# Patient Record
Sex: Male | Born: 1950 | Race: Black or African American | Hispanic: No | Marital: Single | Smoking: Current every day smoker
Health system: Southern US, Community
[De-identification: ages and names within clinical notes are randomized; demographics above are authoritative.]

## PROBLEM LIST (undated history)

## (undated) DIAGNOSIS — I10 Essential (primary) hypertension: Secondary | ICD-10-CM

## (undated) DIAGNOSIS — E785 Hyperlipidemia, unspecified: Secondary | ICD-10-CM

---

## 2007-12-23 ENCOUNTER — Emergency Department: Payer: Self-pay | Admitting: Emergency Medicine

## 2014-01-22 ENCOUNTER — Emergency Department: Payer: Self-pay | Admitting: Emergency Medicine

## 2014-01-22 LAB — CBC
HCT: 37.7 % — ABNORMAL LOW (ref 40.0–52.0)
HGB: 12.8 g/dL — AB (ref 13.0–18.0)
MCH: 33 pg (ref 26.0–34.0)
MCHC: 33.9 g/dL (ref 32.0–36.0)
MCV: 97 fL (ref 80–100)
Platelet: 165 10*3/uL (ref 150–440)
RBC: 3.88 10*6/uL — ABNORMAL LOW (ref 4.40–5.90)
RDW: 12.5 % (ref 11.5–14.5)
WBC: 9.1 10*3/uL (ref 3.8–10.6)

## 2014-01-22 LAB — DRUG SCREEN, URINE

## 2014-01-22 LAB — MAGNESIUM: MAGNESIUM: 2 mg/dL

## 2014-01-22 LAB — BASIC METABOLIC PANEL
Anion Gap: 4 — ABNORMAL LOW (ref 7–16)
BUN: 13 mg/dL (ref 7–18)
CALCIUM: 8.7 mg/dL (ref 8.5–10.1)
Chloride: 107 mmol/L (ref 98–107)
Co2: 30 mmol/L (ref 21–32)
Creatinine: 1.13 mg/dL (ref 0.60–1.30)
EGFR (African American): >60
EGFR (Non-African Amer.): >60
GLUCOSE: 114 mg/dL — AB (ref 65–99)
Osmolality: 282 (ref 275–301)
POTASSIUM: 4.1 mmol/L (ref 3.5–5.1)
SODIUM: 141 mmol/L (ref 136–145)

## 2014-01-22 LAB — URINALYSIS, COMPLETE
BACTERIA: NONE SEEN
BLOOD: NEGATIVE
Bilirubin,UR: NEGATIVE
GLUCOSE, UR: NEGATIVE mg/dL (ref 0–75)
Ketone: NEGATIVE
Nitrite: NEGATIVE
Ph: 6 (ref 4.5–8.0)
Protein: NEGATIVE
RBC,UR: 1 /HPF (ref 0–5)
Specific Gravity: 1.019 (ref 1.003–1.030)
Squamous Epithelial: 1
WBC UR: 2 /HPF (ref 0–5)

## 2014-01-22 LAB — TROPONIN I: Troponin-I: <0.02 ng/mL

## 2017-02-05 ENCOUNTER — Other Ambulatory Visit: Payer: Self-pay | Admitting: Family Medicine

## 2017-02-05 DIAGNOSIS — Z136 Encounter for screening for cardiovascular disorders: Secondary | ICD-10-CM

## 2017-04-10 ENCOUNTER — Ambulatory Visit
Admission: RE | Admit: 2017-04-10 | Discharge: 2017-04-10 | Disposition: A | Discharge status: Home / Self Care | Payer: BLUE CROSS/BLUE SHIELD | Source: Ambulatory Visit | Attending: Family Medicine | Admitting: Family Medicine

## 2017-04-10 DIAGNOSIS — I77811 Abdominal aortic ectasia: Secondary | ICD-10-CM | POA: Diagnosis not present

## 2017-04-10 DIAGNOSIS — Z136 Encounter for screening for cardiovascular disorders: Secondary | ICD-10-CM | POA: Diagnosis not present

## 2018-10-09 ENCOUNTER — Emergency Department
Admission: EM | Admit: 2018-10-09 | Discharge: 2018-10-09 | Disposition: A | Discharge status: Home / Self Care | Payer: BLUE CROSS/BLUE SHIELD | Attending: Emergency Medicine | Admitting: Emergency Medicine

## 2018-10-09 ENCOUNTER — Encounter: Payer: Self-pay | Admitting: Emergency Medicine

## 2018-10-09 ENCOUNTER — Other Ambulatory Visit: Payer: Self-pay

## 2018-10-09 DIAGNOSIS — R51 Headache: Secondary | ICD-10-CM | POA: Insufficient documentation

## 2018-10-09 DIAGNOSIS — I10 Essential (primary) hypertension: Secondary | ICD-10-CM | POA: Insufficient documentation

## 2018-10-09 DIAGNOSIS — M542 Cervicalgia: Secondary | ICD-10-CM | POA: Diagnosis present

## 2018-10-09 DIAGNOSIS — F17200 Nicotine dependence, unspecified, uncomplicated: Secondary | ICD-10-CM | POA: Insufficient documentation

## 2018-10-09 DIAGNOSIS — R519 Headache, unspecified: Secondary | ICD-10-CM

## 2018-10-09 HISTORY — DX: Essential (primary) hypertension: I10

## 2018-10-09 MED ORDER — OXYCODONE-ACETAMINOPHEN 5-325 MG PO TABS: 1.0000 | ORAL_TABLET | Freq: Four times a day (QID) | ORAL | 0 refills | Status: AC | PRN

## 2018-10-09 MED ORDER — ACETAMINOPHEN 500 MG PO TABS
1000.0000 mg | ORAL_TABLET | Freq: Once | ORAL | Status: AC
  Administered 2018-10-09: 1000 mg via ORAL
  Filled 2018-10-09: qty 2

## 2018-10-09 MED ORDER — PREDNISONE 10 MG PO TABS: 40.0000 mg | ORAL_TABLET | Freq: Every day | ORAL | 0 refills | Status: AC

## 2018-10-09 MED ORDER — DIAZEPAM 5 MG PO TABS: 5.0000 mg | ORAL_TABLET | Freq: Three times a day (TID) | ORAL | 0 refills | Status: AC | PRN

## 2018-10-09 MED ORDER — IBUPROFEN 600 MG PO TABS
600.0000 mg | ORAL_TABLET | Freq: Once | ORAL | Status: AC
  Administered 2018-10-09: 600 mg via ORAL
  Filled 2018-10-09: qty 1

## 2018-10-09 NOTE — ED Provider Notes (Signed)
[facility] Emergency Department Provider Note  ____________________________________________  Time seen: Approximately 7:35 AM  I have reviewed the triage vital signs and the nursing notes.   HISTORY  Chief Complaint Headache    HPI Larry Dalton is a 67 y.o. male with a history of hypertension presenting with left neck pain and mild posterior headache.  The patient reports that since receiving a flu shot last Friday, he has been having a mild headache at the base of his skull with left lateral neck pain which extends to the mid clavicular area and is worse with movements of his arm or rotation of his neck. He has not had any trauma and denies any fevers or chills, numbness tingling or weakness.  No tick bites.  He has never had this in the past.  He has tried Motrin without any improvement.  Past Medical History:  Diagnosis Date  . Hypertension     There are no active problems to display for this patient.   History reviewed. No pertinent surgical history.  Current Outpatient Rx  . Order #: [id number] Class: Print  . Order #: [id number] Class: Print    Allergies Penicillins  No family history on file.  Social History Social History   Tobacco Use  . Smoking status: Current Every Day Smoker  . Smokeless tobacco: Never Used  Substance Use Topics  . Alcohol use: Not on file  . Drug use: Not on file    Review of Systems Constitutional: No fever/chills.  No trauma.  No lightheadedness or syncope.  No loss of consciousness. Eyes: No visual changes.  No blurred or double vision. ENT: No sore throat. No congestion or rhinorrhea. Cardiovascular: Denies chest pain. Denies palpitations. Respiratory: Denies shortness of breath.  No cough. Gastrointestinal: No abdominal pain.  No nausea, no vomiting.  No diarrhea.  No constipation. Musculoskeletal: Negative for back pain.  Positive for left lateral neck pain which extends to the midclavicular line and  is worse with positional changes or raising of the left arm. Skin: Negative for rash. Neurological: Positive for posterior headaches. No focal numbness, tingling or weakness.  No changes in vision, speech or ability to walk.  No changes in mental status.    ____________________________________________   PHYSICAL EXAM:  VITAL SIGNS: ED Triage Vitals  Enc Vitals Group     BP 10/09/18 0634 (!) 144/75     Pulse Rate 10/09/18 0634 81     Resp 10/09/18 0634 16     Temp 10/09/18 0634 98.6 F (37 C)     Temp Source 10/09/18 0634 Oral     SpO2 10/09/18 0634 97 %     Weight 10/09/18 0629 180 lb (81.6 kg)     Height 10/09/18 0629 6\' 1"  (1.854 m)     Head Circumference --      Peak Flow --      Pain Score 10/09/18 0629 10     Pain Loc --      Pain Edu? --      Excl. in GC? --     Constitutional: Alert and oriented. Answers questions appropriately.  Mildly uncomfortable appearing. Eyes: Conjunctivae are normal.  EOMI. PERRLA . no scleral icterus. Head: Atraumatic. Nose: No congestion/rhinnorhea. Mouth/Throat: Mucous membranes are moist.  Neck: No stridor.  Supple.  No midline C-spine tenderness to palpation, step-offs or deformities.  The patient does have palpable tenderness to palpation at the base of the left lateral neck and extending over the trapezius muscle to the  midclavicular line.  This pain is also reproducible with raising of the left arm or right or left lateral neck movement.  Normal left carotid pulse.  No evidence of skin changes including swelling, bruising or palpable crepitus. Cardiovascular: Normal rate, regular rhythm. No murmurs, rubs or gallops.  Respiratory: Normal respiratory effort.  No accessory muscle use or retractions. Lungs CTAB.  No wheezes, rales or ronchi. Musculoskeletal: No midline T or L-spine tenderness to palpation, step-offs or deformities. Neurologic:  A&Ox3.  Speech is clear.  Face and smile are symmetric.  EOMI. PERRLA.  No horizontal or vertical  nystagmus.  5 out of 5 grip strength bilaterally, biceps and triceps strength bilaterally.  Normal gait without ataxia.  Normal sensation to light touch in the bilateral upper extremities. Skin:  Skin is warm, dry and intact. No rash noted. Psychiatric: Mood and affect are normal.   ____________________________________________   LABS (all labs ordered are listed, but only abnormal results are displayed)  Labs Reviewed - No data to display ____________________________________________  EKG  Not indicated ____________________________________________  RADIOLOGY  No results found.  ____________________________________________   PROCEDURES  Procedure(s) performed: None  Procedures  Critical Care performed: No ____________________________________________   INITIAL IMPRESSION / ASSESSMENT AND PLAN / ED COURSE  Pertinent labs & imaging results that were available during my care of the patient were reviewed by me and considered in my medical decision making (see chart for details).  67 y.o. male with a history of hypertension presenting with left lateral neck pain that is positional and reproducible upon palpation without any trauma.  Overall, the patient is hemodynamically stable and afebrile.  Most likely etiology of the patient's symptoms is musculoskeletal strain.  Radiculopathy is also considered although the patient has no sensory or motor deficits, no history of spine trauma.  Meningitis is considered but very unlikely.  Torticollis, vascular compromise, or underlying mass are considered as well but again very unlikely.  At this time, we will proceed with conservative management of his symptoms; he will be discharged home with Valium as an antispasmodic, Percocet for severe pain as an adjunct to baseline Tylenol and Motrin, and a 5-day course of 40 mg prednisone.  He has been instructed to call his primary care physician for follow-up appointment on Monday.  At this time, the  patient is safe for discharge home and he understands follow-up instructions as well as return precautions.  ____________________________________________  FINAL CLINICAL IMPRESSION(S) / ED DIAGNOSES  Final diagnoses:  Neck pain on left side  Nonintractable headache, unspecified chronicity pattern, unspecified headache type         NEW MEDICATIONS STARTED DURING THIS VISIT:  New Prescriptions   DIAZEPAM (VALIUM) 5 MG TABLET    Take 1 tablet (5 mg total) by mouth every 8 (eight) hours as needed for anxiety or muscle spasms.   OXYCODONE-ACETAMINOPHEN (PERCOCET) 5-325 MG TABLET    Take 1 tablet by mouth every 6 (six) hours as needed for severe pain.      Rockne Menghini, MD 10/09/18 [phone]

## 2018-10-09 NOTE — ED Triage Notes (Signed)
Patient ambulatory to triage with steady gait, without difficulty or distress noted; pt reports HA , upper back pain x week; st pain increases with movement

## 2018-10-09 NOTE — Discharge Instructions (Signed)
Please continue to take Tylenol or Motrin for mild to moderate pain.  Percocet is for severe pain and Valium as for muscle spasms.  Do not drive within 8 hours of taking Percocet or Valium.  You may use an ice pack, or heat pack, whichever feels better, for 15 minutes every hour when you are awake on the left side of your neck.  Return to the emergency department if you develop severe pain, lightheadedness or fainting, numbness tingling or weakness, changes in vision, speech or mental status, or for any other symptoms concerning to you.

## 2018-10-14 ENCOUNTER — Telehealth: Payer: Self-pay | Admitting: *Deleted

## 2018-10-14 NOTE — Telephone Encounter (Signed)
Received referral for low dose lung cancer screening CT scan.  Unable to leave message at this time, will attempt call at later point.       

## 2018-10-21 ENCOUNTER — Telehealth: Payer: Self-pay | Admitting: *Deleted

## 2018-10-21 NOTE — Telephone Encounter (Signed)
Received referral for low dose lung cancer screening CT scan. Message left with mother to call me back to facilitate scheduling scan.

## 2018-10-23 ENCOUNTER — Telehealth: Payer: Self-pay | Admitting: *Deleted

## 2018-10-23 NOTE — Telephone Encounter (Signed)
Received referral for initial lung cancer screening scan. Contacted patient and obtained smoking history, which the patient describes is not more than 0.5 packs per day x 20 years. Discussed that patient is not eligible for lung cancer screening due to history <30 pack years. Encouraged patient to consider our smoking cessation programs.       Glenna FellowsShawn Delman Goshorn 10/23/2018, 4:05 PM

## 2018-10-23 NOTE — Telephone Encounter (Signed)
Received referral for low dose lung cancer screening CT scan. Message left at phone number listed in EMR for patient to call me back to facilitate scheduling scan.  

## 2021-01-10 ENCOUNTER — Observation Stay: Payer: BC Managed Care – PPO

## 2021-01-10 ENCOUNTER — Other Ambulatory Visit: Payer: Self-pay

## 2021-01-10 ENCOUNTER — Emergency Department: Payer: BC Managed Care – PPO

## 2021-01-10 ENCOUNTER — Encounter: Payer: Self-pay | Admitting: Emergency Medicine

## 2021-01-10 ENCOUNTER — Observation Stay
Admission: EM | Admit: 2021-01-10 | Discharge: 2021-01-11 | Disposition: A | Discharge status: Home / Self Care | Payer: BC Managed Care – PPO | Attending: Hospitalist | Admitting: Hospitalist

## 2021-01-10 DIAGNOSIS — I10 Essential (primary) hypertension: Secondary | ICD-10-CM | POA: Diagnosis not present

## 2021-01-10 DIAGNOSIS — R200 Anesthesia of skin: Secondary | ICD-10-CM | POA: Diagnosis present

## 2021-01-10 DIAGNOSIS — Z20822 Contact with and (suspected) exposure to covid-19: Secondary | ICD-10-CM | POA: Diagnosis not present

## 2021-01-10 DIAGNOSIS — F17200 Nicotine dependence, unspecified, uncomplicated: Secondary | ICD-10-CM | POA: Insufficient documentation

## 2021-01-10 DIAGNOSIS — R29898 Other symptoms and signs involving the musculoskeletal system: Secondary | ICD-10-CM

## 2021-01-10 DIAGNOSIS — I6350 Cerebral infarction due to unspecified occlusion or stenosis of unspecified cerebral artery: Principal | ICD-10-CM | POA: Insufficient documentation

## 2021-01-10 DIAGNOSIS — F17213 Nicotine dependence, cigarettes, with withdrawal: Secondary | ICD-10-CM

## 2021-01-10 DIAGNOSIS — I639 Cerebral infarction, unspecified: Secondary | ICD-10-CM | POA: Diagnosis not present

## 2021-01-10 DIAGNOSIS — R2990 Unspecified symptoms and signs involving the nervous system: Secondary | ICD-10-CM

## 2021-01-10 DIAGNOSIS — I6782 Cerebral ischemia: Secondary | ICD-10-CM | POA: Insufficient documentation

## 2021-01-10 DIAGNOSIS — M50322 Other cervical disc degeneration at C5-C6 level: Secondary | ICD-10-CM | POA: Diagnosis not present

## 2021-01-10 DIAGNOSIS — M50321 Other cervical disc degeneration at C4-C5 level: Secondary | ICD-10-CM | POA: Insufficient documentation

## 2021-01-10 DIAGNOSIS — Z79899 Other long term (current) drug therapy: Secondary | ICD-10-CM | POA: Diagnosis not present

## 2021-01-10 HISTORY — DX: Hyperlipidemia, unspecified: E78.5

## 2021-01-10 LAB — DIFFERENTIAL
Abs Immature Granulocytes: 0.01 10*3/uL (ref 0.00–0.07)
Basophils Absolute: 0 10*3/uL (ref 0.0–0.1)
Basophils Relative: 0 %
Eosinophils Absolute: 0.1 10*3/uL (ref 0.0–0.5)
Eosinophils Relative: 2 %
Immature Granulocytes: 0 %
Lymphocytes Relative: 21 %
Lymphs Abs: 1.3 10*3/uL (ref 0.7–4.0)
Monocytes Absolute: 0.4 10*3/uL (ref 0.1–1.0)
Monocytes Relative: 7 %
Neutro Abs: 4.1 10*3/uL (ref 1.7–7.7)
Neutrophils Relative %: 70 %

## 2021-01-10 LAB — COMPREHENSIVE METABOLIC PANEL
ALT: 16 U/L (ref 0–44)
AST: 23 U/L (ref 15–41)
Albumin: 4.1 g/dL (ref 3.5–5.0)
Alkaline Phosphatase: 63 U/L (ref 38–126)
Anion gap: 10 (ref 5–15)
BUN: 20 mg/dL (ref 8–23)
CO2: 25 mmol/L (ref 22–32)
Calcium: 9.2 mg/dL (ref 8.9–10.3)
Chloride: 106 mmol/L (ref 98–111)
Creatinine, Ser: 1.09 mg/dL (ref 0.61–1.24)
GFR, Estimated: >60 mL/min (ref >60)
Glucose, Bld: 158 mg/dL — ABNORMAL HIGH (ref 70–99)
Potassium: 3.5 mmol/L (ref 3.5–5.1)
Sodium: 141 mmol/L (ref 135–145)
Total Bilirubin: 0.6 mg/dL (ref 0.3–1.2)
Total Protein: 7.6 g/dL (ref 6.5–8.1)

## 2021-01-10 LAB — APTT: aPTT: 29 seconds (ref 24–36)

## 2021-01-10 LAB — CBC
HCT: 32.8 % — ABNORMAL LOW (ref 39.0–52.0)
Hemoglobin: 10.8 g/dL — ABNORMAL LOW (ref 13.0–17.0)
MCH: 32.3 pg (ref 26.0–34.0)
MCHC: 32.9 g/dL (ref 30.0–36.0)
MCV: 98.2 fL (ref 80.0–100.0)
Platelets: 161 10*3/uL (ref 150–400)
RBC: 3.34 MIL/uL — ABNORMAL LOW (ref 4.22–5.81)
RDW: 12.3 % (ref 11.5–15.5)
WBC: 5.9 10*3/uL (ref 4.0–10.5)
nRBC: 0 % (ref 0.0–0.2)

## 2021-01-10 LAB — HIV ANTIBODY (ROUTINE TESTING W REFLEX): HIV Screen 4th Generation wRfx: NONREACTIVE

## 2021-01-10 LAB — PROTIME-INR
INR: 1.1 (ref 0.8–1.2)
Prothrombin Time: 13.3 seconds (ref 11.4–15.2)

## 2021-01-10 MED ORDER — ATORVASTATIN CALCIUM 20 MG PO TABS
40.0000 mg | ORAL_TABLET | Freq: Every day | ORAL | Status: DC
  Administered 2021-01-11: 40 mg via ORAL
  Filled 2021-01-10: qty 2

## 2021-01-10 MED ORDER — ACETAMINOPHEN 160 MG/5ML PO SOLN
650.0000 mg | ORAL | Status: DC | PRN
  Filled 2021-01-10: qty 20.3

## 2021-01-10 MED ORDER — SODIUM CHLORIDE 0.9 % IV SOLN: INTRAVENOUS | Status: DC

## 2021-01-10 MED ORDER — STROKE: EARLY STAGES OF RECOVERY BOOK: Freq: Once | Status: DC

## 2021-01-10 MED ORDER — GADOBUTROL 1 MMOL/ML IV SOLN
8.0000 mL | Freq: Once | INTRAVENOUS | Status: AC | PRN
  Administered 2021-01-10: 8 mL via INTRAVENOUS

## 2021-01-10 MED ORDER — ACETAMINOPHEN 650 MG RE SUPP: 650.0000 mg | RECTAL | Status: DC | PRN

## 2021-01-10 MED ORDER — ASPIRIN 300 MG RE SUPP
300.0000 mg | Freq: Every day | RECTAL | Status: DC
  Filled 2021-01-10: qty 1

## 2021-01-10 MED ORDER — ASPIRIN 325 MG PO TABS
325.0000 mg | ORAL_TABLET | Freq: Every day | ORAL | Status: DC
  Administered 2021-01-10 – 2021-01-11 (×2): 325 mg via ORAL
  Filled 2021-01-10 (×2): qty 1

## 2021-01-10 MED ORDER — ACETAMINOPHEN 325 MG PO TABS: 650.0000 mg | ORAL_TABLET | ORAL | Status: DC | PRN

## 2021-01-10 NOTE — ED Notes (Signed)
Neurologist at bedside. 

## 2021-01-10 NOTE — Consult Note (Signed)
Neurology Consultation Reason for Consult: Left-sided weakness Referring Physician: Erma Heritage, C  CC: Arm weakness  History is obtained from: Patient  HPI: Larry Dalton is a 70 y.o. male who was last definitely in his normal state of health on Sunday evening.  On Monday morning, he did not use his left hand much until he got to work and at that point he noticed that he was having trouble using it as he normally does.  He worked both Monday and yesterday and given that it was still having problems, he sought care in the emergency department today.  There he describes it as "numbness" what he means by that is that it will not do what he wants it to, not that it has a difference in sensation.   LKW: Sunday evening tpa given?: no, out of window    ROS: A 14 point ROS was performed and is negative except as noted in the HPI.  Past Medical History:  Diagnosis Date  . Hyperlipidemia   . Hypertension      History reviewed. No pertinent family history.   Social History:  reports that he has been smoking. He has never used smokeless tobacco. No history on file for alcohol use and drug use.   Exam: Current vital signs: BP (!) 149/78   Pulse (!) 53   Temp 98.3 F (36.8 C) (Oral)   Resp 17   Ht 6\' 2"  (1.88 m)   Wt 81.6 kg   SpO2 100%   BMI 23.11 kg/m  Vital signs in last 24 hours: Temp:  [98.3 F (36.8 C)] 98.3 F (36.8 C) (02/02 1144) Pulse Rate:  [47-66] 53 (02/02 1430) Resp:  [11-17] 17 (02/02 1430) BP: (141-153)/(75-91) 149/78 (02/02 1430) SpO2:  [99 %-100 %] 100 % (02/02 1430) Weight:  [81.6 kg] 81.6 kg (02/02 1144)   Physical Exam  Constitutional: Appears well-developed and well-nourished.  Psych: Affect appropriate to situation Eyes: No scleral injection HENT: No OP obstruction MSK: no joint deformities.  Cardiovascular: Normal rate and regular rhythm.  Respiratory: Effort normal, non-labored breathing GI: Soft.  No distension. There is no tenderness.  Skin:  WDI  Neuro: Mental Status: Patient is awake, alert, oriented to person, place, month, year, and situation. Patient is able to give a clear and coherent history. No signs of aphasia or neglect Cranial Nerves: II: Visual Fields are full. Pupils are equal, round, and reactive to light.   III,IV, VI: EOMI without ptosis or diploplia.  V: Facial sensation is symmetric to temperature VII: Facial movement with very mild left facial weakness VIII: hearing is intact to voice X: Uvula elevates symmetrically XI: Shoulder shrug is symmetric. XII: tongue is midline without atrophy or fasciculations.  Motor: Tone is normal. Bulk is normal. 5/5 strength was present in bilateral legs in the right arm, he has 4/5 weakness of multiple muscle groups involving multiple nerves in the left arm Sensory: Sensation is symmetric to light touch and temperature in the arms and legs. Cerebellar: No clear ataxia     I have reviewed labs in epic and the results pertinent to this consultation are: Creatinine 1.09  I have reviewed the images obtained: CT head-negative  Impression: 70 year old male with history of hypertension and hyperlipidemia who presents with left-sided deficits of 2 days duration.  I suspect that he has had a small ischemic stroke, and he will need to be admitted for work-up of such.  Recommendations: - HgbA1c, fasting lipid panel - MRI, MRA  of the  brain without contrast, MRA neck with/without contrast - Frequent neuro checks - Echocardiogram - Prophylactic therapy-Antiplatelet med: Aspirin -81 mg daily as well as Plavix 75 mg daily following 300 mg load - Risk factor modification - Telemetry monitoring - PT consult, OT consult, Speech consult   Ritta Slot, MD [facility] [phone]  If 7pm- 7am, please page neurology on call as listed in AMION.

## 2021-01-10 NOTE — ED Notes (Signed)
Patient eating meal tray

## 2021-01-10 NOTE — H&P (Signed)
History and Physical    Larry Dalton KZL:[id number] DOB: January 18, 1951 DOA: 01/10/2021  PCP: Dione Housekeeper, MD   Patient coming from: Home patient I have personally briefly reviewed patient's old medical records in [facility]  Chief Complaint: Left hand numbness   HPI: Larry Dalton is a 70 y.o. male with medical history significant for hypertension, dyslipidemia nicotine dependence who presents to the emergency room for evaluation of numbness involving his left hand which he has had for about 2 days.  Patient stated that he had gone to work and had difficulty using his left hand and so he went to see his primary care provider who advised that he go to the emergency room. He denies having any weakness in his lower extremities or numbness, he has no vision changes, no difficulty swallowing, no changes in his speech.  Per patient he feels like the left side of his face is a little off. He denies having any nausea, no vomiting, no abdominal pain, no chest pain, no shortness of breath, no dizziness, no lightheadedness, no headache, no blurred vision.  No urinary frequency, no nocturia, no dysuria, no cough, no fever. Labs show sodium 141, potassium 3.5, chloride 106, bicarb 50 BUN 20, creatinine 1.09, calcium 9.2, alkaline phosphatase 63, albumin 4.1, AST 23, ALT 16, total protein 7.6, white count 5.9, hemoglobin 10.8, hematocrit 32.8, MCV 98.2, RDW 12.3, platelet count 161, PT 13.3, INR 1.1 Patient SARS-CoV-2 virus PCR test is pending CT scan of the head without contrast/cervical spine CT shows no acute intracranial findings.  No acute fracture or traumatic listhesis of the cervical spine.  Multilevel degenerative disc and facet disease of the cervical spine, most pronounced at C3 -C4 and C5 -C6.  Emphysematous changes within the visualized lung apices. Twelve-lead EKG reviewed by me shows sinus rhythm with no acute ST-T wave changes   ED Course: Patient is a 70 year old  African-American male who presents to the ER for evaluation of a 2-day history of numbness involving his left hand.  He has no other focal deficits besides a mild left facial droop.  He will be referred to observation status to rule out an acute stroke.  Review of Systems: As per HPI otherwise all systems reviewed and negative.    Past Medical History:  Diagnosis Date  . Hyperlipidemia   . Hypertension     History reviewed. No pertinent surgical history.   reports that he has been smoking. He has never used smokeless tobacco. No history on file for alcohol use and drug use.  Allergies  Allergen Reactions  . Penicillins     History reviewed. No pertinent family history.   Prior to Admission medications   Medication Sig Start Date End Date Taking? Authorizing Provider  atorvastatin (LIPITOR) 40 MG tablet Take 40 mg by mouth daily. 01/09/21  Yes [provider]  hydrochlorothiazide (HYDRODIURIL) 25 MG tablet Take 25 mg by mouth daily. 01/09/21  Yes [provider]  KLOR-CON M20 20 MEQ tablet Take 20 mEq by mouth 2 (two) times daily. 01/09/21  Yes [provider]  tadalafil (CIALIS) 5 MG tablet Take 5 mg by mouth daily. 01/09/21  Yes [provider]    Physical Exam: Vitals:   01/10/21 1600 01/10/21 1630 01/10/21 1700 01/10/21 1730  BP: (!) 149/77 (!) 147/75 (!) 146/85 (!) 156/59  Pulse: (!) 53 (!) 52 100 (!) 56  Resp: 14 17 20 18   Temp:      TempSrc:  SpO2: 91% 99% 100% 100%  Weight:      Height:         Vitals:   01/10/21 1600 01/10/21 1630 01/10/21 1700 01/10/21 1730  BP: (!) 149/77 (!) 147/75 (!) 146/85 (!) 156/59  Pulse: (!) 53 (!) 52 100 (!) 56  Resp: 14 17 20 18   Temp:      TempSrc:      SpO2: 91% 99% 100% 100%  Weight:      Height:        Constitutional: NAD, alert and oriented x 3 Eyes: PERRL, lids and conjunctivae normal ENMT: Mucous membranes are moist.  Neck: normal, supple, no masses, no thyromegaly Respiratory:  clear to auscultation bilaterally, no wheezing, no crackles. Normal respiratory effort. No accessory muscle use.  Cardiovascular: Regular rate and rhythm, no murmurs / rubs / gallops. No extremity edema. 2+ pedal pulses. No carotid bruits.  Abdomen: no tenderness, no masses palpated. No hepatosplenomegaly. Bowel sounds positive.  Musculoskeletal: no clubbing / cyanosis. No joint deformity upper and lower extremities.  Skin: no rashes, lesions, ulcers.  Neurologic: No gross focal neurologic deficit.  Left hand numbness Psychiatric: Normal mood and affect.   Labs on Admission: I have personally reviewed following labs and imaging studies  CBC: Recent Labs  Lab 01/10/21 1151  WBC 5.9  NEUTROABS 4.1  HGB 10.8*  HCT 32.8*  MCV 98.2  PLT 161   Basic Metabolic Panel: Recent Labs  Lab 01/10/21 1151  NA 141  K 3.5  CL 106  CO2 25  GLUCOSE 158*  BUN 20  CREATININE 1.09  CALCIUM 9.2   GFR: Estimated Creatinine Clearance: 72.8 mL/min (by C-G formula based on SCr of 1.09 mg/dL). Liver Function Tests: Recent Labs  Lab 01/10/21 1151  AST 23  ALT 16  ALKPHOS 63  BILITOT 0.6  PROT 7.6  ALBUMIN 4.1   No results for input(s): LIPASE, AMYLASE in the last 168 hours. No results for input(s): AMMONIA in the last 168 hours. Coagulation Profile: Recent Labs  Lab 01/10/21 1151  INR 1.1   Cardiac Enzymes: No results for input(s): CKTOTAL, CKMB, CKMBINDEX, TROPONINI in the last 168 hours. BNP (last 3 results) No results for input(s): PROBNP in the last 8760 hours. HbA1C: No results for input(s): HGBA1C in the last 72 hours. CBG: No results for input(s): GLUCAP in the last 168 hours. Lipid Profile: No results for input(s): CHOL, HDL, LDLCALC, TRIG, CHOLHDL, LDLDIRECT in the last 72 hours. Thyroid Function Tests: No results for input(s): TSH, T4TOTAL, FREET4, T3FREE, THYROIDAB in the last 72 hours. Anemia Panel: No results for input(s): VITAMINB12, FOLATE, FERRITIN, TIBC, IRON,  RETICCTPCT in the last 72 hours. Urine analysis:    Component Value Date/Time   COLORURINE Yellow 01/22/2014 1025   APPEARANCEUR Clear 01/22/2014 1025   LABSPEC 1.019 01/22/2014 1025   PHURINE 6.0 01/22/2014 1025   GLUCOSEU Negative 01/22/2014 1025   HGBUR Negative 01/22/2014 1025   BILIRUBINUR Negative 01/22/2014 1025   KETONESUR Negative 01/22/2014 1025   PROTEINUR Negative 01/22/2014 1025   NITRITE Negative 01/22/2014 1025   LEUKOCYTESUR 1+ 01/22/2014 1025    Radiological Exams on Admission: CT HEAD WO CONTRAST  Result Date: 01/10/2021 CLINICAL DATA:  Left arm numbness EXAM: CT HEAD WITHOUT CONTRAST CT CERVICAL SPINE WITHOUT CONTRAST TECHNIQUE: Multidetector CT imaging of the head and cervical spine was performed following the standard protocol without intravenous contrast. Multiplanar CT image reconstructions of the cervical spine were also generated. COMPARISON:  None. FINDINGS: CT HEAD  FINDINGS Brain: No evidence of acute infarction, hemorrhage, hydrocephalus, extra-axial collection or mass lesion/mass effect. Scattered low-density changes within the periventricular and subcortical white matter compatible with chronic microvascular ischemic change. Vascular: Atherosclerotic calcifications involving the large vessels of the skull base. No unexpected hyperdense vessel. Skull: Normal. Negative for fracture or focal lesion. Sinuses/Orbits: No acute finding. Other: None. CT CERVICAL SPINE FINDINGS Alignment: 3 mm grade 1 anterolisthesis C4 on C5. Trace retrolisthesis C3 on C4. Straightening of the cervical lordosis. No facet joint dislocation. Dens and lateral masses aligned. Skull base and vertebrae: No acute fracture. No primary bone lesion or focal pathologic process. Soft tissues and spinal canal: No prevertebral fluid or swelling. No visible canal hematoma. Disc levels: Multilevel intervertebral disc height loss most pronounced C3-4 and C5-6. Multilevel facet and uncovertebral hypertrophy  contribute to multilevel bony foraminal narrowing most pronounced at C4-5 on the right and C3-4 bilaterally. Upper chest: Right worse than left paraseptal emphysema. Other: Bilateral carotid atherosclerosis. IMPRESSION: 1. No acute intracranial findings. 2. No acute fracture or traumatic listhesis of the cervical spine. 3. 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 4. Emphysematous changes within the visualized lung apices. ([id number]) Electronically Signed   By: Duanne Guess D.O.   On: 01/10/2021 13:51   CT Cervical Spine Wo Contrast  Result Date: 01/10/2021 CLINICAL DATA:  Left arm numbness EXAM: CT HEAD WITHOUT CONTRAST CT CERVICAL SPINE WITHOUT CONTRAST TECHNIQUE: Multidetector CT imaging of the head and cervical spine was performed following the standard protocol without intravenous contrast. Multiplanar CT image reconstructions of the cervical spine were also generated. COMPARISON:  None. FINDINGS: CT HEAD FINDINGS Brain: No evidence of acute infarction, hemorrhage, hydrocephalus, extra-axial collection or mass lesion/mass effect. Scattered low-density changes within the periventricular and subcortical white matter compatible with chronic microvascular ischemic change. Vascular: Atherosclerotic calcifications involving the large vessels of the skull base. No unexpected hyperdense vessel. Skull: Normal. Negative for fracture or focal lesion. Sinuses/Orbits: No acute finding. Other: None. CT CERVICAL SPINE FINDINGS Alignment: 3 mm grade 1 anterolisthesis C4 on C5. Trace retrolisthesis C3 on C4. Straightening of the cervical lordosis. No facet joint dislocation. Dens and lateral masses aligned. Skull base and vertebrae: No acute fracture. No primary bone lesion or focal pathologic process. Soft tissues and spinal canal: No prevertebral fluid or swelling. No visible canal hematoma. Disc levels: Multilevel intervertebral disc height loss most  pronounced C3-4 and C5-6. Multilevel facet and uncovertebral hypertrophy contribute to multilevel bony foraminal narrowing most pronounced at C4-5 on the right and C3-4 bilaterally. Upper chest: Right worse than left paraseptal emphysema. Other: Bilateral carotid atherosclerosis. IMPRESSION: 1. No acute intracranial findings. 2. No acute fracture or traumatic listhesis of the cervical spine. 3. 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 4. Emphysematous changes within the visualized lung apices. ([id number]) Electronically Signed   By: Duanne Guess D.O.   On: 01/10/2021 13:51    EKG: Independently reviewed.  Sinus rhythm  Assessment/Plan Active Problems:   CVA (cerebral vascular accident) (HCC)   Essential hypertension   Nicotine dependence     Rule out acute CVA Obtain MRI of the brain without contrast Obtain 2D echocardiogram to rule out cardiac thrombus Place patient on aspirin and high intensity statins We will request PT/OT/ST evaluation Allow for permissive hypertension until acute stroke is ruled out    Nicotine dependence Smoking cessation has been discussed with patient in detail We  will place patient on a nicotine transdermal patch 14 mg daily   DVT prophylaxis: SCD Code Status: Full code Family Communication: Greater than 50% of time was spent discussing patient's condition and plan of care with him at the bedside.  All questions and concerns have been addressed.  He verbalizes understanding and agrees with the plan. Disposition Plan: Back to previous home environment Consults called: Neurology    Lucile Shutters MD [facility]     01/10/2021, 5:59 PM

## 2021-01-10 NOTE — ED Provider Notes (Signed)
[facility] Emergency Department Provider Note  ____________________________________________   Event Date/Time   First MD Initiated Contact with Patient 01/10/21 1307     (approximate)  I have reviewed the triage vital signs and the nursing notes.   HISTORY  Chief Complaint Numbness    HPI Larry Dalton is a 70 y.o. male  With h/o HTN, HLD, here with left arm numbness. Pt reports sx began yesterday around 8 am while at work with acute onset left arm numbness, weakness. Reports he tried to work throughout the day but had difficulty 2/2 weakness and loss of coordination. Sx persisted this AM so he presents for eval after going to his PCP this morning. Denies any associated sx other than feeling like his face is "a little off" on the left. No leg weakness or numbness. No vision changes. No difficulty swallowing or speaking.        Past Medical History:  Diagnosis Date  . Hyperlipidemia   . Hypertension     Patient Active Problem List   Diagnosis Date Noted  . CVA (cerebral vascular accident) (HCC) 01/10/2021    History reviewed. No pertinent surgical history.  Prior to Admission medications   Medication Sig Start Date End Date Taking? Authorizing Provider  atorvastatin (LIPITOR) 40 MG tablet Take 40 mg by mouth daily. 01/09/21  Yes [provider]  hydrochlorothiazide (HYDRODIURIL) 25 MG tablet Take 25 mg by mouth daily. 01/09/21  Yes [provider]  KLOR-CON M20 20 MEQ tablet Take 20 mEq by mouth 2 (two) times daily. 01/09/21  Yes [provider]  tadalafil (CIALIS) 5 MG tablet Take 5 mg by mouth daily. 01/09/21  Yes [provider]    Allergies Penicillins  History reviewed. No pertinent family history.  Social History Social History   Tobacco Use  . Smoking status: Current Every Day Smoker  . Smokeless tobacco: Never Used    Review of Systems  Review of Systems  Constitutional: Negative for chills  and fever.  HENT: Negative for sore throat.   Respiratory: Negative for shortness of breath.   Cardiovascular: Negative for chest pain.  Gastrointestinal: Negative for abdominal pain.  Genitourinary: Negative for flank pain.  Musculoskeletal: Negative for neck pain.  Skin: Negative for rash and wound.  Allergic/Immunologic: Negative for immunocompromised state.  Neurological: Positive for weakness and numbness.  Hematological: Does not bruise/bleed easily.  All other systems reviewed and are negative.    ____________________________________________  PHYSICAL EXAM:      VITAL SIGNS: ED Triage Vitals  Enc Vitals Group     BP 01/10/21 1144 (!) 153/91     Pulse Rate 01/10/21 1144 66     Resp 01/10/21 1144 17     Temp 01/10/21 1144 98.3 F (36.8 C)     Temp Source 01/10/21 1144 Oral     SpO2 01/10/21 1144 100 %     Weight 01/10/21 1144 180 lb (81.6 kg)     Height 01/10/21 1144 6\' 2"  (1.88 m)     Head Circumference --      Peak Flow --      Pain Score 01/10/21 1144 0     Pain Loc --      Pain Edu? --      Excl. in GC? --      Physical Exam Vitals and nursing note reviewed.  Constitutional:      General: He is not in acute distress.    Appearance: He is well-developed and  well-nourished.  HENT:     Head: Normocephalic and atraumatic.  Eyes:     Conjunctiva/sclera: Conjunctivae normal.  Cardiovascular:     Rate and Rhythm: Normal rate and regular rhythm.     Heart sounds: Normal heart sounds.  Pulmonary:     Effort: Pulmonary effort is normal. No respiratory distress.     Breath sounds: No wheezing.  Abdominal:     General: There is no distension.  Musculoskeletal:        General: No edema.     Cervical back: Neck supple.  Skin:    General: Skin is warm.     Capillary Refill: Capillary refill takes less than 2 seconds.     Findings: No rash.  Neurological:     Mental Status: He is alert and oriented to person, place, and time.     Motor: No abnormal muscle  tone.      Neurological Exam:  Mental Status: Alert and oriented to person, place, and time. Attention and concentration normal. Speech clear. Recent memory is intact. Cranial Nerves: Visual fields grossly intact. EOMI and PERRLA. No nystagmus noted. Facial sensation intact at forehead, maxillary cheek, and chin/mandible bilaterally. Subtle L NLF flattening. Hearing grossly normal. Uvula is midline, and palate elevates symmetrically. Normal SCM and trapezius strength. Tongue midline without fasciculations. Motor: Muscle strength 5/5 in proximal and distal RUE, RLE, LLE. Strength 4/5 in distal LUE, with slight pronator drift. Muscle tone normal. Sensation: Intact to light touch in upper and lower extremities distally bilaterally.  Gait: Normal without ataxia. Coordination: Normal FTN bilaterally.    ____________________________________________   LABS (all labs ordered are listed, but only abnormal results are displayed)  Labs Reviewed  CBC - Abnormal; Notable for the following components:      Result Value   RBC 3.34 (*)    Hemoglobin 10.8 (*)    HCT 32.8 (*)    All other components within normal limits  COMPREHENSIVE METABOLIC PANEL - Abnormal; Notable for the following components:   Glucose, Bld 158 (*)    All other components within normal limits  SARS CORONAVIRUS 2 (TAT 6-24 HRS)  PROTIME-INR  APTT  DIFFERENTIAL  HIV ANTIBODY (ROUTINE TESTING W REFLEX)  HEMOGLOBIN A1C  LIPID PANEL    ____________________________________________  EKG: Normal sinus rhythm, VR 51. PR 178, QRS 100, QTc 406. No acute st elevations or derpessions. NO ischemia or infarct. ________________________________________  RADIOLOGY All imaging, including plain films, CT scans, and ultrasounds, independently reviewed by me, and interpretations confirmed via formal radiology reads.  ED MD interpretation:   CT Head/C-SPine: No acute findings, DDD  Official radiology report(s): CT HEAD WO  CONTRAST  Result Date: 01/10/2021 CLINICAL DATA:  Left arm numbness EXAM: CT HEAD WITHOUT CONTRAST CT CERVICAL SPINE WITHOUT CONTRAST TECHNIQUE: Multidetector CT imaging of the head and cervical spine was performed following the standard protocol without intravenous contrast. Multiplanar CT image reconstructions of the cervical spine were also generated. COMPARISON:  None. FINDINGS: CT HEAD FINDINGS Brain: No evidence of acute infarction, hemorrhage, hydrocephalus, extra-axial collection or mass lesion/mass effect. Scattered low-density changes within the periventricular and subcortical white matter compatible with chronic microvascular ischemic change. Vascular: Atherosclerotic calcifications involving the large vessels of the skull base. No unexpected hyperdense vessel. Skull: Normal. Negative for fracture or focal lesion. Sinuses/Orbits: No acute finding. Other: None. CT CERVICAL SPINE FINDINGS Alignment: 3 mm grade 1 anterolisthesis C4 on C5. Trace retrolisthesis C3 on C4. Straightening of the cervical lordosis. No facet joint dislocation. Dens  and lateral masses aligned. Skull base and vertebrae: No acute fracture. No primary bone lesion or focal pathologic process. Soft tissues and spinal canal: No prevertebral fluid or swelling. No visible canal hematoma. Disc levels: Multilevel intervertebral disc height loss most pronounced C3-4 and C5-6. Multilevel facet and uncovertebral hypertrophy contribute to multilevel bony foraminal narrowing most pronounced at C4-5 on the right and C3-4 bilaterally. Upper chest: Right worse than left paraseptal emphysema. Other: Bilateral carotid atherosclerosis. IMPRESSION: 1. No acute intracranial findings. 2. No acute fracture or traumatic listhesis of the cervical spine. 3. 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 4. Emphysematous changes within the visualized lung apices. ([id number]) Electronically Signed   By:  Duanne Guess D.O.   On: 01/10/2021 13:51   CT Cervical Spine Wo Contrast  Result Date: 01/10/2021 CLINICAL DATA:  Left arm numbness EXAM: CT HEAD WITHOUT CONTRAST CT CERVICAL SPINE WITHOUT CONTRAST TECHNIQUE: Multidetector CT imaging of the head and cervical spine was performed following the standard protocol without intravenous contrast. Multiplanar CT image reconstructions of the cervical spine were also generated. COMPARISON:  None. FINDINGS: CT HEAD FINDINGS Brain: No evidence of acute infarction, hemorrhage, hydrocephalus, extra-axial collection or mass lesion/mass effect. Scattered low-density changes within the periventricular and subcortical white matter compatible with chronic microvascular ischemic change. Vascular: Atherosclerotic calcifications involving the large vessels of the skull base. No unexpected hyperdense vessel. Skull: Normal. Negative for fracture or focal lesion. Sinuses/Orbits: No acute finding. Other: None. CT CERVICAL SPINE FINDINGS Alignment: 3 mm grade 1 anterolisthesis C4 on C5. Trace retrolisthesis C3 on C4. Straightening of the cervical lordosis. No facet joint dislocation. Dens and lateral masses aligned. Skull base and vertebrae: No acute fracture. No primary bone lesion or focal pathologic process. Soft tissues and spinal canal: No prevertebral fluid or swelling. No visible canal hematoma. Disc levels: Multilevel intervertebral disc height loss most pronounced C3-4 and C5-6. Multilevel facet and uncovertebral hypertrophy contribute to multilevel bony foraminal narrowing most pronounced at C4-5 on the right and C3-4 bilaterally. Upper chest: Right worse than left paraseptal emphysema. Other: Bilateral carotid atherosclerosis. IMPRESSION: 1. No acute intracranial findings. 2. No acute fracture or traumatic listhesis of the cervical spine. 3. 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 4. Emphysematous changes within  the visualized lung apices. ([id number]) Electronically Signed   By: Duanne Guess D.O.   On: 01/10/2021 13:51    ____________________________________________  PROCEDURES   Procedure(s) performed (including Critical Care):  Procedures  ____________________________________________  INITIAL IMPRESSION / MDM / ASSESSMENT AND PLAN / ED COURSE  As part of my medical decision making, I reviewed the following data within the electronic MEDICAL RECORD NUMBER Nursing notes reviewed and incorporated, Old chart reviewed, Notes from prior ED visits, and [place] Controlled Substance Database       *Larry Dalton was evaluated in Emergency Department on 01/10/2021 for the symptoms described in the history of present illness. He was evaluated in the context of the global COVID-19 pandemic, which necessitated consideration that the patient might be at risk for infection with the SARS-CoV-2 virus that causes COVID-19. Institutional protocols and algorithms that pertain to the evaluation of patients at risk for COVID-19 are in a state of rapid change based on information released by regulatory bodies including the CDC and federal and state organizations. These policies and algorithms were followed during the patient's care in the ED.  Some ED evaluations and  interventions may be delayed as a result of limited staffing during the pandemic.*     Medical Decision Making:  70 yo M here with LUE weakness, subtle left NLF/facial droop. Suspect acute CVA. Less likely cervical radiculopathy given the distribution is not c/w peripheral etiology. CT head/C Spine reviewed and are unremarkable. Labs are overall very reassuring. Will admit for CVA work-up.  ____________________________________________  FINAL CLINICAL IMPRESSION(S) / ED DIAGNOSES  Final diagnoses:  Left arm weakness  Stroke-like episode     MEDICATIONS GIVEN DURING THIS VISIT:  Medications   stroke: mapping our early stages of recovery book (0  each Does not apply Hold 01/10/21 1504)  acetaminophen (TYLENOL) tablet 650 mg (has no administration in time range)    Or  acetaminophen (TYLENOL) 160 MG/5ML solution 650 mg (has no administration in time range)    Or  acetaminophen (TYLENOL) suppository 650 mg (has no administration in time range)  0.9 %  sodium chloride infusion ( Intravenous New Bag/Given 01/10/21 1515)  aspirin suppository 300 mg ( Rectal See Alternative 01/10/21 1513)    Or  aspirin tablet 325 mg (325 mg Oral Given 01/10/21 1513)  atorvastatin (LIPITOR) tablet 40 mg (has no administration in time range)     ED Discharge Orders    None       Note:  This document was prepared using Dragon voice recognition software and may include unintentional dictation errors.   Shaune Pollack, MD 01/10/21 1750

## 2021-01-10 NOTE — ED Triage Notes (Signed)
Pt comes into the ED via POV c/o left arm numbness that started yesterday at 8:00am.  Pt states his arm is still numb.  Denies any tingling.  Pt denies any numbness anywhere else and he is currently ambulatory to triage at this time.  Pt denies any CP{, SHOB, or dizziness.  Pt neurologically intact.  Pt states he did notice some pain behind the bicep yesterday as well.

## 2021-01-10 NOTE — Progress Notes (Signed)
SLP Cancellation Note  Patient Details Name: Larry Dalton MRN: [id number] DOB: 07-05-1951   Cancelled treatment:       Reason Eval/Treat Not Completed: SLP screened, no needs identified, will sign off (chart reviewed; consulted NSG then met w/ pt). Pt denied any difficulty swallowing and is currently on a regular diet; tolerates swallowing pills w/ water per NSG. He had not had a meal since admitting so Dinner was ordered for him. Pt conversed at conversational level w/out deficits noted; he ordered his Dinner meal w/ this SLP appropriately. Pt and NSG denied any speech-language deficits. Pt stated he needed to see the MD re: his Left hand numbness being resolved now, and that he needed "to go to take care of my 9yo Mother at home". This was relayed to NSG/MD.  No further skilled ST services indicated as pt appears at his baseline. Pt agreed. NSG to reconsult if any change in status while admitted.       Orinda Kenner, MS, CCC-SLP Speech Language Pathologist Rehab Services [phone] [facility] 01/10/2021, 5:18 PM

## 2021-01-11 ENCOUNTER — Observation Stay: Admit: 2021-01-11 | Payer: BC Managed Care – PPO

## 2021-01-11 ENCOUNTER — Observation Stay: Payer: BC Managed Care – PPO

## 2021-01-11 DIAGNOSIS — I6350 Cerebral infarction due to unspecified occlusion or stenosis of unspecified cerebral artery: Secondary | ICD-10-CM | POA: Diagnosis not present

## 2021-01-11 DIAGNOSIS — I639 Cerebral infarction, unspecified: Secondary | ICD-10-CM | POA: Diagnosis not present

## 2021-01-11 LAB — LIPID PANEL
Cholesterol: 100 mg/dL (ref 0–200)
HDL: 50 mg/dL (ref >40)
LDL Cholesterol: 41 mg/dL (ref 0–99)
Total CHOL/HDL Ratio: 2 RATIO
Triglycerides: 43 mg/dL (ref <150)
VLDL: 9 mg/dL (ref 0–40)

## 2021-01-11 LAB — HEMOGLOBIN A1C
Hgb A1c MFr Bld: 5.1 % (ref 4.8–5.6)
Mean Plasma Glucose: 99.67 mg/dL

## 2021-01-11 LAB — SARS CORONAVIRUS 2 (TAT 6-24 HRS): SARS Coronavirus 2: NEGATIVE

## 2021-01-11 MED ORDER — CLOPIDOGREL BISULFATE 75 MG PO TABS: 75.0000 mg | ORAL_TABLET | Freq: Every day | ORAL | Status: DC

## 2021-01-11 MED ORDER — CLOPIDOGREL BISULFATE 75 MG PO TABS
300.0000 mg | ORAL_TABLET | Freq: Once | ORAL | Status: AC
  Administered 2021-01-11: 300 mg via ORAL
  Filled 2021-01-11: qty 4

## 2021-01-11 MED ORDER — CLOPIDOGREL BISULFATE 75 MG PO TABS: 75.0000 mg | ORAL_TABLET | Freq: Every day | ORAL | 2 refills | Status: AC

## 2021-01-11 MED ORDER — ASPIRIN EC 81 MG PO TBEC: 81.0000 mg | DELAYED_RELEASE_TABLET | Freq: Every day | ORAL | 0 refills | Status: AC

## 2021-01-11 NOTE — Plan of Care (Signed)
Stroke prevention discussed with patient. Verbalized understanding

## 2021-01-11 NOTE — Evaluation (Signed)
Physical Therapy Evaluation Patient Details Name: Larry Dalton MRN: [id number] DOB: September 01, 1951 Today's Date: 01/11/2021   History of Present Illness  Pt is a 70 y.o. male presenting to hospital 2/2 with acute onset L arm numbness and weakness and L subtle facial droop.  MRI showing small area acute ischemia w/in R centrum semiovale.  PMH includes htn and HLD.  Clinical Impression  Prior to hospital admission, pt was independent; lives with his mother in 1 level home with ramp to enter.  Currently pt is modified independent with bed mobility; independent with transfers; and independent with ambulation 240 feet (no AD).  No loss of balance noted during sessions activities.  Intact B LE strength, light touch sensation, tone, proprioception, and heel to shin coordination.  Pt reporting L UE symptoms resolved (mild L UE weakness noted compared to R UE); plan for OT to see pt today.  No acute PT needs identified; will sign off.  Please re-consult PT if pt's status changes and acute PT needs are identified.    Follow Up Recommendations No PT follow up    Equipment Recommendations  None recommended by PT    Recommendations for Other Services OT consult     Precautions / Restrictions Precautions Precautions: None Restrictions Weight Bearing Restrictions: No      Mobility  Bed Mobility Overal bed mobility: Modified Independent             General bed mobility comments: HOB elevated; no difficulties noted    Transfers Overall transfer level: Independent Equipment used: None             General transfer comment: steady safe transfers noted  Ambulation/Gait Ambulation/Gait assistance: Independent Gait Distance (Feet): 240 Feet Assistive device: None Gait Pattern/deviations: WFL(Within Functional Limits)     General Gait Details: steady ambulation  Stairs Stairs:  (Deferred (pt reports ramp into 1 level home))          Wheelchair Mobility    Modified Rankin  (Stroke Patients Only)       Balance Overall balance assessment: Independent (no loss of balance with sitting and dynamic standing activities)                                           Pertinent Vitals/Pain Pain Assessment: No/denies pain  Vitals (HR and O2 on room air) stable and WFL throughout treatment session.    Home Living Family/patient expects to be discharged to:: Private residence Living Arrangements: Parent (pt's mom)   Type of Home: Mobile home (Double wide) Home Access: Ramped entrance     Home Layout: One level Home Equipment: None      Prior Function Level of Independence: Independent         Comments: Pt reports no falls in past 6 months.  (+) working.     Hand Dominance        Extremity/Trunk Assessment   Upper Extremity Assessment Upper Extremity Assessment: Defer to OT evaluation (R hand grip strength > L hand grip strength; 4/5 L shoulder flexion)    Lower Extremity Assessment Lower Extremity Assessment:  (5/5 B hip flexion, knee flexion/extension, and DF/PF AROM strength; intact B LE sensation (light touch), intact heel to shin coordination B, intact B LE proprioception, and intact B LE tone)    Cervical / Trunk Assessment Cervical / Trunk Assessment: Normal  Communication   Communication: No difficulties  Cognition Arousal/Alertness: Awake/alert Behavior During Therapy: WFL for tasks assessed/performed Overall Cognitive Status: Within Functional Limits for tasks assessed                                        General Comments   Nursing cleared pt for participation in physical therapy.  Pt agreeable to PT session.    Exercises     Assessment/Plan    PT Assessment Patent does not need any further PT services  PT Problem List         PT Treatment Interventions      PT Goals (Current goals can be found in the Care Plan section)  Acute Rehab PT Goals Patient Stated Goal: to go home PT Goal  Formulation: With patient Time For Goal Achievement: 01/25/21 Potential to Achieve Goals: Good    Frequency     Barriers to discharge        Co-evaluation               AM-PAC PT "6 Clicks" Mobility  Outcome Measure Help needed turning from your back to your side while in a flat bed without using bedrails?: None Help needed moving from lying on your back to sitting on the side of a flat bed without using bedrails?: None Help needed moving to and from a bed to a chair (including a wheelchair)?: None Help needed standing up from a chair using your arms (e.g., wheelchair or bedside chair)?: None Help needed to walk in hospital room?: None Help needed climbing 3-5 steps with a railing? 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

## 2021-01-11 NOTE — Discharge Summary (Signed)
Physician Discharge Summary   Larry Dalton  male DOB: 06-10-1951  ZOX:[id number]  PCP: Dione Housekeeperlmedo, Mario Ernesto, MD  Admit date: 01/10/2021 Discharge date: 01/11/2021  Admitted From: home Disposition:  home CODE STATUS: Full code  Discharge Instructions    Discharge instructions   Complete by: As directed    You have had a small stroke.  Neurologist recommends taking aspirin 81 mg and Plavix 75 mg daily together for 21 days, and then after that, just Plavix 75 mg daily alone.  Please follow up with your primary care doctor to continue prescription for Plavix.   Dr. Darlin Priestlyina Jeanelle Dake [facility]- -       Hospital Course:  For full details, please see H&P, progress notes, consult notes and ancillary notes.  Briefly,  Larry Dalton is a 70 y.o. male with medical history significant for hypertension, dyslipidemia nicotine dependence who presents to the emergency room for evaluation of numbness involving his left hand which he has had for about 2 days.  Patient stated that he had gone to work and had difficulty using his left hand and so he went to see his primary care provider who advised that he go to the emergency room.  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  MRA head/eck - no significant stenosis.  LDL 41 A1C - 5.1 Neuro consulted and rec 1) aspirin 81 mg and Plavix 75 mg daily for 3 weeks, followed by Plavix monotherapy 2) continue Lipitor 40 mg daily 3) hypertension control Smoking cessation.  Nicotine dependence Smoking cessation has been discussed with patient in detail   Discharge Diagnoses:  Active Problems:   CVA (cerebral vascular accident) [facility](HCC)   Essential hypertension   Nicotine dependence    Discharge Instructions:  Allergies as of 01/11/2021      Reactions   Penicillins       Medication List    TAKE these medications   aspirin EC 81 MG tablet Take 1 tablet (81 mg total) by mouth daily for 21 days. Swallow whole.   atorvastatin 40 MG  tablet Commonly known as: LIPITOR Take 40 mg by mouth daily.   clopidogrel 75 MG tablet Commonly known as: PLAVIX Take 1 tablet (75 mg total) by mouth daily. To help prevent strokes. Start taking on: January 12, 2021   hydrochlorothiazide 25 MG tablet Commonly known as: HYDRODIURIL Take 25 mg by mouth daily.   Klor-Con M20 20 MEQ tablet Generic drug: potassium chloride SA Take 20 mEq by mouth 2 (two) times daily.   tadalafil 5 MG tablet Commonly known as: CIALIS Take 5 mg by mouth daily.        Follow-up Information    Zada Finderslmedo, Joycie PeekMario Ernesto, MD. Schedule an appointment as soon as possible for a visit in 1 week(s).   Specialty: Family Medicine Contact information: [id number] [place] [id number] [phone]               Allergies  Allergen Reactions  . Penicillins      The results of significant diagnostics from this hospitalization (including imaging, microbiology, ancillary and laboratory) are listed below for reference.   Consultations:   Procedures/Studies: CT HEAD WO CONTRAST  Result Date: 01/10/2021 CLINICAL DATA:  Left arm numbness EXAM: CT HEAD WITHOUT CONTRAST CT CERVICAL SPINE WITHOUT CONTRAST TECHNIQUE: Multidetector CT imaging of the head and cervical spine was performed following the standard protocol without intravenous contrast. Multiplanar CT image reconstructions of the cervical spine were also generated. COMPARISON:  None. FINDINGS: CT HEAD FINDINGS Brain: No evidence of acute infarction, hemorrhage, hydrocephalus, extra-axial collection or mass lesion/mass effect. Scattered low-density changes within the periventricular and subcortical white matter compatible with chronic microvascular ischemic change. Vascular: Atherosclerotic calcifications involving the large vessels of the skull base. No unexpected hyperdense vessel. Skull: Normal. Negative for fracture or focal lesion. Sinuses/Orbits: No acute finding. Other: None. CT CERVICAL SPINE  FINDINGS Alignment: 3 mm grade 1 anterolisthesis C4 on C5. Trace retrolisthesis C3 on C4. Straightening of the cervical lordosis. No facet joint dislocation. Dens and lateral masses aligned. Skull base and vertebrae: No acute fracture. No primary bone lesion or focal pathologic process. Soft tissues and spinal canal: No prevertebral fluid or swelling. No visible canal hematoma. Disc levels: Multilevel intervertebral disc height loss most pronounced C3-4 and C5-6. Multilevel facet and uncovertebral hypertrophy contribute to multilevel bony foraminal narrowing most pronounced at C4-5 on the right and C3-4 bilaterally. Upper chest: Right worse than left paraseptal emphysema. Other: Bilateral carotid atherosclerosis. IMPRESSION: 1. No acute intracranial findings. 2. No acute fracture or traumatic listhesis of the cervical spine. 3. 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 4. Emphysematous changes within the visualized lung apices. ([id number]) Electronically Signed   By: Duanne Guess D.O.   On: 01/10/2021 13:51   CT Cervical Spine Wo Contrast  Result Date: 01/10/2021 CLINICAL DATA:  Left arm numbness EXAM: CT HEAD WITHOUT CONTRAST CT CERVICAL SPINE WITHOUT CONTRAST TECHNIQUE: Multidetector CT imaging of the head and cervical spine was performed following the standard protocol without intravenous contrast. Multiplanar CT image reconstructions of the cervical spine were also generated. COMPARISON:  None. FINDINGS: CT HEAD FINDINGS Brain: No evidence of acute infarction, hemorrhage, hydrocephalus, extra-axial collection or mass lesion/mass effect. Scattered low-density changes within the periventricular and subcortical white matter compatible with chronic microvascular ischemic change. Vascular: Atherosclerotic calcifications involving the large vessels of the skull base. No unexpected hyperdense vessel. Skull: Normal. Negative for fracture or focal  lesion. Sinuses/Orbits: No acute finding. Other: None. CT CERVICAL SPINE FINDINGS Alignment: 3 mm grade 1 anterolisthesis C4 on C5. Trace retrolisthesis C3 on C4. Straightening of the cervical lordosis. No facet joint dislocation. Dens and lateral masses aligned. Skull base and vertebrae: No acute fracture. No primary bone lesion or focal pathologic process. Soft tissues and spinal canal: No prevertebral fluid or swelling. No visible canal hematoma. Disc levels: Multilevel intervertebral disc height loss most pronounced C3-4 and C5-6. Multilevel facet and uncovertebral hypertrophy contribute to multilevel bony foraminal narrowing most pronounced at C4-5 on the right and C3-4 bilaterally. Upper chest: Right worse than left paraseptal emphysema. Other: Bilateral carotid atherosclerosis. IMPRESSION: 1. No acute intracranial findings. 2. No acute fracture or traumatic listhesis of the cervical spine. 3. 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 4. Emphysematous changes within the visualized lung apices. ([id number]) Electronically Signed   By: Duanne Guess D.O.   On: 01/10/2021 13:51   MR ANGIO HEAD WO CONTRAST  Result Date: 01/10/2021 CLINICAL DATA:  Acute neurologic deficit. EXAM: MR HEAD WITHOUT CONTRAST MR CIRCLE OF WILLIS WITHOUT CONTRAST MRA OF THE NECK WITHOUT CONTRAST TECHNIQUE: Multiplanar, multiecho pulse sequences of the brain, circle of willis and surrounding structures were obtained without intravenous contrast. Angiographic images of the neck were obtained using MRA technique without intravenous contrast. COMPARISON:  None. FINDINGS: MR HEAD FINDINGS Brain: Small area of acute ischemia within the right centrum semiovale. No  acute or chronic hemorrhage. There is multifocal hyperintense T2-weighted signal within the white matter. Parenchymal volume and CSF spaces are normal. The midline structures are normal. Vascular: Major flow voids are  preserved. Skull and upper cervical spine: Normal calvarium and skull base. Visualized upper cervical spine and soft tissues are normal. Sinuses/Orbits:No paranasal sinus fluid levels or advanced mucosal thickening. No mastoid or middle ear effusion. Normal orbits. MR CIRCLE OF WILLIS FINDINGS POSTERIOR CIRCULATION: --Vertebral arteries: Normal --Inferior cerebellar arteries: Normal. --Basilar artery: Normal. --Superior cerebellar arteries: Normal. --Posterior cerebral arteries: Normal. ANTERIOR CIRCULATION: --Intracranial internal carotid arteries: Normal. --Anterior cerebral arteries (ACA): Normal. --Middle cerebral arteries (MCA): Normal. ANATOMIC VARIANTS: None MRA NECK FINDINGS Limited field of view time-of-flight MRA shows no stenosis at the carotid bifurcations. IMPRESSION: 1. Small area of acute ischemia within the right centrum semiovale. No hemorrhage or mass effect. 2. Normal intracranial MRA. 3. Limited MRA of the neck shows no hemodynamically significant stenosis at the carotid bifurcations. Electronically Signed   By: Deatra Robinson M.D.   On: 01/10/2021 22:44   MR ANGIO NECK WO CONTRAST  Result Date: 01/11/2021 CLINICAL DATA:  Acute neurologic deficit. EXAM: MR HEAD WITHOUT CONTRAST MR CIRCLE OF WILLIS WITHOUT CONTRAST MRA OF THE NECK WITHOUT CONTRAST TECHNIQUE: Multiplanar, multiecho pulse sequences of the brain, circle of willis and surrounding structures were obtained without intravenous contrast. Angiographic images of the neck were obtained using MRA technique without intravenous contrast. COMPARISON:  None. FINDINGS: MR HEAD FINDINGS Brain: Small area of acute ischemia within the right centrum semiovale. No acute or chronic hemorrhage. There is multifocal hyperintense T2-weighted signal within the white matter. Parenchymal volume and CSF spaces are normal. The midline structures are normal. Vascular: Major flow voids are preserved. Skull and upper cervical spine: Normal calvarium and skull  base. Visualized upper cervical spine and soft tissues are normal. Sinuses/Orbits:No paranasal sinus fluid levels or advanced mucosal thickening. No mastoid or middle ear effusion. Normal orbits. MR CIRCLE OF WILLIS FINDINGS POSTERIOR CIRCULATION: --Vertebral arteries: Normal --Inferior cerebellar arteries: Normal. --Basilar artery: Normal. --Superior cerebellar arteries: Normal. --Posterior cerebral arteries: Normal. ANTERIOR CIRCULATION: --Intracranial internal carotid arteries: Normal. --Anterior cerebral arteries (ACA): Normal. --Middle cerebral arteries (MCA): Normal. ANATOMIC VARIANTS: None MRA NECK FINDINGS Limited field of view time-of-flight MRA shows no stenosis at the carotid bifurcations. IMPRESSION: 1. Small area of acute ischemia within the right centrum semiovale. No hemorrhage or mass effect. 2. Normal intracranial MRA. 3. Limited MRA of the neck shows no hemodynamically significant stenosis at the carotid bifurcations. Electronically Signed   By: Deatra Robinson M.D.   On: 01/10/2021 22:44   MR ANGIO NECK W WO CONTRAST  Result Date: 01/10/2021 CLINICAL DATA:  Acute neurologic deficit. EXAM: MR HEAD WITHOUT CONTRAST MR CIRCLE OF WILLIS WITHOUT CONTRAST MRA OF THE NECK WITHOUT CONTRAST TECHNIQUE: Multiplanar, multiecho pulse sequences of the brain, circle of willis and surrounding structures were obtained without intravenous contrast. Angiographic images of the neck were obtained using MRA technique without intravenous contrast. COMPARISON:  None. FINDINGS: MR HEAD FINDINGS Brain: Small area of acute ischemia within the right centrum semiovale. No acute or chronic hemorrhage. There is multifocal hyperintense T2-weighted signal within the white matter. Parenchymal volume and CSF spaces are normal. The midline structures are normal. Vascular: Major flow voids are preserved. Skull and upper cervical spine: Normal calvarium and skull base. Visualized upper cervical spine and soft tissues are normal.  Sinuses/Orbits:No paranasal sinus fluid levels or advanced mucosal thickening. No mastoid or middle ear effusion. Normal orbits.  MR CIRCLE OF WILLIS FINDINGS POSTERIOR CIRCULATION: --Vertebral arteries: Normal --Inferior cerebellar arteries: Normal. --Basilar artery: Normal. --Superior cerebellar arteries: Normal. --Posterior cerebral arteries: Normal. ANTERIOR CIRCULATION: --Intracranial internal carotid arteries: Normal. --Anterior cerebral arteries (ACA): Normal. --Middle cerebral arteries (MCA): Normal. ANATOMIC VARIANTS: None MRA NECK FINDINGS Limited field of view time-of-flight MRA shows no stenosis at the carotid bifurcations. IMPRESSION: 1. Small area of acute ischemia within the right centrum semiovale. No hemorrhage or mass effect. 2. Normal intracranial MRA. 3. Limited MRA of the neck shows no hemodynamically significant stenosis at the carotid bifurcations. Electronically Signed   By: Deatra Robinson M.D.   On: 01/10/2021 22:44   MR BRAIN WO CONTRAST  Result Date: 01/10/2021 CLINICAL DATA:  Acute neurologic deficit. EXAM: MR HEAD WITHOUT CONTRAST MR CIRCLE OF WILLIS WITHOUT CONTRAST MRA OF THE NECK WITHOUT CONTRAST TECHNIQUE: Multiplanar, multiecho pulse sequences of the brain, circle of willis and surrounding structures were obtained without intravenous contrast. Angiographic images of the neck were obtained using MRA technique without intravenous contrast. COMPARISON:  None. FINDINGS: MR HEAD FINDINGS Brain: Small area of acute ischemia within the right centrum semiovale. No acute or chronic hemorrhage. There is multifocal hyperintense T2-weighted signal within the white matter. Parenchymal volume and CSF spaces are normal. The midline structures are normal. Vascular: Major flow voids are preserved. Skull and upper cervical spine: Normal calvarium and skull base. Visualized upper cervical spine and soft tissues are normal. Sinuses/Orbits:No paranasal sinus fluid levels or advanced mucosal thickening.  No mastoid or middle ear effusion. Normal orbits. MR CIRCLE OF WILLIS FINDINGS POSTERIOR CIRCULATION: --Vertebral arteries: Normal --Inferior cerebellar arteries: Normal. --Basilar artery: Normal. --Superior cerebellar arteries: Normal. --Posterior cerebral arteries: Normal. ANTERIOR CIRCULATION: --Intracranial internal carotid arteries: Normal. --Anterior cerebral arteries (ACA): Normal. --Middle cerebral arteries (MCA): Normal. ANATOMIC VARIANTS: None MRA NECK FINDINGS Limited field of view time-of-flight MRA shows no stenosis at the carotid bifurcations. IMPRESSION: 1. Small area of acute ischemia within the right centrum semiovale. No hemorrhage or mass effect. 2. Normal intracranial MRA. 3. Limited MRA of the neck shows no hemodynamically significant stenosis at the carotid bifurcations. Electronically Signed   By: Deatra Robinson M.D.   On: 01/10/2021 22:44      Labs: BNP (last 3 results) No results for input(s): BNP in the last 8760 hours. Basic Metabolic Panel: Recent Labs  Lab 01/10/21 1151  NA 141  K 3.5  CL 106  CO2 25  GLUCOSE 158*  BUN 20  CREATININE 1.09  CALCIUM 9.2   Liver Function Tests: Recent Labs  Lab 01/10/21 1151  AST 23  ALT 16  ALKPHOS 63  BILITOT 0.6  PROT 7.6  ALBUMIN 4.1   No results for input(s): LIPASE, AMYLASE in the last 168 hours. No results for input(s): AMMONIA in the last 168 hours. CBC: Recent Labs  Lab 01/10/21 1151  WBC 5.9  NEUTROABS 4.1  HGB 10.8*  HCT 32.8*  MCV 98.2  PLT 161   Cardiac Enzymes: No results for input(s): CKTOTAL, CKMB, CKMBINDEX, TROPONINI in the last 168 hours. BNP: Invalid input(s): POCBNP CBG: No results for input(s): GLUCAP in the last 168 hours. D-Dimer No results for input(s): DDIMER in the last 72 hours. Hgb A1c Recent Labs    01/11/21 0332  HGBA1C 5.1   Lipid Profile Recent Labs    01/11/21 0332  CHOL 100  HDL 50  LDLCALC 41  TRIG 43  CHOLHDL 2.0   Thyroid function studies No results  for input(s): TSH, T4TOTAL, T3FREE, THYROIDAB  in the last 72 hours.  Invalid input(s): FREET3 Anemia work up No results for input(s): VITAMINB12, FOLATE, FERRITIN, TIBC, IRON, RETICCTPCT in the last 72 hours. Urinalysis    Component Value Date/Time   COLORURINE Yellow 01/22/2014 1025   APPEARANCEUR Clear 01/22/2014 1025   LABSPEC 1.019 01/22/2014 1025   PHURINE 6.0 01/22/2014 1025   GLUCOSEU Negative 01/22/2014 1025   HGBUR Negative 01/22/2014 1025   BILIRUBINUR Negative 01/22/2014 1025   KETONESUR Negative 01/22/2014 1025   PROTEINUR Negative 01/22/2014 1025   NITRITE Negative 01/22/2014 1025   LEUKOCYTESUR 1+ 01/22/2014 1025   Sepsis Labs Invalid input(s): PROCALCITONIN,  WBC,  LACTICIDVEN Microbiology Recent Results (from the past 240 hour(s))  SARS CORONAVIRUS 2 (TAT 6-24 HRS) Nasopharyngeal Nasopharyngeal Swab     Status: None   Collection Time: 01/10/21  3:15 PM   Specimen: Nasopharyngeal Swab  Result Value Ref Range Status   SARS Coronavirus 2 NEGATIVE NEGATIVE Final    Comment: (NOTE) SARS-CoV-2 target nucleic acids are NOT DETECTED.  The SARS-CoV-2 RNA is generally detectable in upper and lower respiratory specimens during the acute phase of infection. Negative results do not preclude SARS-CoV-2 infection, do not rule out co-infections with other pathogens, and should not be used as the sole basis for treatment or other patient management decisions. Negative results must be combined with clinical observations, patient history, and epidemiological information. The expected result is Negative.  Fact Sheet for Patients: HairSlick.no  Fact Sheet for Healthcare Providers: quierodirigir.com  This test is not yet approved or cleared by the [place] FDA and  has been authorized for detection and/or diagnosis of SARS-CoV-2 by FDA under an Emergency Use Authorization (EUA). This EUA will remain  in effect  (meaning this test can be used) for the duration of the COVID-19 declaration under Se ction 564(b)(1) of the Act, 21 U.S.C. section 360bbb-3(b)(1), unless the authorization is terminated or revoked sooner.  Performed at [facility], [address], [place], [place] [address]      Total time spend on discharging this patient, including the last patient exam, discussing the hospital stay, instructions for ongoing care as it relates to all pertinent caregivers, as well as preparing the medical discharge records, prescriptions, and/or referrals as applicable, is 40 minutes.    Darlin Priestly, MD  [facility] 01/11/2021, 11:23 AM

## 2021-01-11 NOTE — Progress Notes (Signed)
Subjective: No significant changes  Exam: Vitals:   01/11/21 0436 01/11/21 0747  BP: (!) 143/75 127/68  Pulse: (!) 53 (!) 55  Resp: 18 18  Temp: 98.4 F (36.9 C) 98.5 F (36.9 C)  SpO2: 100% 100%   Gen: In bed, NAD Resp: non-labored breathing, no acute distress Abd: soft, nt  Neuro: MS: Awake, alert, interactive and appropriate CN: Visual fields full, mildly decreased nasolabial fold on the left Motor: He has good strength on the right, decreased fine motor movements on the left Sensory: Intact light touch  Pertinent Labs: MRI brain - subcortical stroke on the right MRA head/eck - no significant stenosis.  LDL 41 A1C - pending  Impression: 70 year old male with a history of hypertension and hyperlipidemia who presents with subcortical infarct.  Thus far no embolic source has been identified, and my suspicion is that this represents small vessel ischemia.  He will need hypertension and hyperlipidemia control over the long-term.  In the short-term, I would favor dual antiplatelet therapy with aspirin and Plavix, followed by monotherapy with Plavix after 3 weeks.  Recommendations: 1) aspirin 81 mg and Plavix 75 mg daily for 3 weeks, followed by Plavix monotherapy 2) continue Lipitor 40 mg daily 3) hypertension control, he is already more than 48 hours from his stroke and therefore no need for further permissive hypertension 4) echocardiogram is pending   Ritta Slot, MD [facility] [phone]  If 7pm- 7am, please page neurology on call as listed in AMION.

## 2021-01-11 NOTE — Evaluation (Signed)
Occupational Therapy Evaluation Patient Details Name: Larry Dalton MRN: [id number] DOB: 1951/01/21 Today's Date: 01/11/2021    History of Present Illness Larry Dalton is a 70 y.o. male presenting to hospital 2/2 with acute onset L arm numbness and weakness and L subtle facial droop.  MRI showing small area acute ischemia w/in R centrum semiovale.  PMH includes htn and HLD.   Clinical Impression   Larry Dalton was seen for OT evaluation this date. Prior to hospital admission, Larry Dalton was Independent in all aspects of ADL/IADL, including working and caring for his mother. Larry Dalton lives with his mother in mobile home with ramped entrance. Currently Larry Dalton reporting symptoms have resolved. Larry Dalton reports completing FMC exercises and LUE strengthening exercises with good demonstration of exercises. Larry Dalton instructed in lifestyle stroke risk factors, verbalizes understanding. Larry Dalton demonstrates baseline independence to perform ADL and mobility tasks and no strength, sensory, coordination, cognitive, or visual deficits appreciated with assessment. No skilled acute OT needs identified. Will sign off. Please re-consult if additional OT needs arise.     Follow Up Recommendations  No OT follow up    Equipment Recommendations  None recommended by OT    Recommendations for Other Services       Precautions / Restrictions Precautions Precautions: None Restrictions Weight Bearing Restrictions: No      Mobility Bed Mobility Overal bed mobility: Modified Independent             General bed mobility comments: HOB elevated; no difficulties noted           ADL either performed or assessed with clinical judgement   ADL Overall ADL's : Independent                                                          Pertinent Vitals/Pain Pain Assessment: No/denies pain     Hand Dominance     Extremity/Trunk Assessment Upper Extremity Assessment Upper Extremity Assessment: Overall WFL for tasks  assessed;LUE deficits/detail LUE Deficits / Details: Strength grossly 4+/5. 5 finger opposition intact, palm to finger translation increased time. LUE Sensation: WNL LUE Coordination: WNL   Lower Extremity Assessment Lower Extremity Assessment: Overall WFL for tasks assessed   Cervical / Trunk Assessment Cervical / Trunk Assessment: Normal   Communication Communication Communication: No difficulties   Cognition Arousal/Alertness: Awake/alert Behavior During Therapy: WFL for tasks assessed/performed Overall Cognitive Status: Within Functional Limits for tasks assessed                                        Exercises Exercises: Other exercises Other Exercises Other Exercises: Larry Dalton educated re: OT role, stroke education/risk factors, HEP Other Exercises: Tooth brushing, bed mobility   Shoulder Instructions      Home Living Family/patient expects to be discharged to:: Private residence Living Arrangements: Parent (56 yo mother) Available Help at Discharge: Family;Friend(s);Available PRN/intermittently Type of Home: Mobile home Home Access: Ramped entrance     Home Layout: One level     Bathroom Shower/Tub: Chief Strategy Officer: Standard     Home Equipment: Bedside commode          Prior Functioning/Environment Level of Independence: Independent        Comments: 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 Denies falls in last 6 mo        OT Problem List: Decreased coordination         OT Goals(Current goals can be found in the care plan section) Acute Rehab OT Goals Patient Stated Goal: to go home OT Goal Formulation: With patient Time For Goal Achievement: 01/25/21 Potential to Achieve Goals: Good   AM-PAC OT "6 Clicks" Daily Activity     Outcome Measure Help from another person eating meals?: None Help from another person taking care of personal grooming?: None Help from another person toileting, which includes using toliet,  bedpan, or urinal?: None Help from another person bathing (including washing, rinsing, drying)?: None Help from another person to put on and taking off regular upper body clothing?: None Help from another person to put on and taking off regular lower body clothing?: None 6 Click Score: 24   End of Session    Activity Tolerance: Patient tolerated treatment well Patient left: in bed;with call bell/phone within reach  OT Visit Diagnosis: Other abnormalities of gait and mobility (R26.89)                Time: 1002-1008 OT Time Calculation (min): 6 min Charges:  OT General Charges $OT Visit: 1 Visit OT Evaluation $OT Eval Low Complexity: 1 Low  Kathie Dike, M.S. OTR/L  01/11/21, 10:19 AM  ascom [phone]

## 2021-01-27 ENCOUNTER — Emergency Department: Payer: BC Managed Care – PPO

## 2021-01-27 ENCOUNTER — Other Ambulatory Visit: Payer: Self-pay

## 2021-01-27 ENCOUNTER — Emergency Department
Admission: EM | Admit: 2021-01-27 | Discharge: 2021-01-27 | Disposition: A | Discharge status: Home / Self Care | Payer: BC Managed Care – PPO | Attending: Emergency Medicine | Admitting: Emergency Medicine

## 2021-01-27 DIAGNOSIS — Z79899 Other long term (current) drug therapy: Secondary | ICD-10-CM | POA: Insufficient documentation

## 2021-01-27 DIAGNOSIS — F17210 Nicotine dependence, cigarettes, uncomplicated: Secondary | ICD-10-CM | POA: Diagnosis not present

## 2021-01-27 DIAGNOSIS — W19XXXA Unspecified fall, initial encounter: Secondary | ICD-10-CM

## 2021-01-27 DIAGNOSIS — Y92009 Unspecified place in unspecified non-institutional (private) residence as the place of occurrence of the external cause: Secondary | ICD-10-CM

## 2021-01-27 DIAGNOSIS — Z7982 Long term (current) use of aspirin: Secondary | ICD-10-CM | POA: Diagnosis not present

## 2021-01-27 DIAGNOSIS — R52 Pain, unspecified: Secondary | ICD-10-CM

## 2021-01-27 DIAGNOSIS — I10 Essential (primary) hypertension: Secondary | ICD-10-CM | POA: Diagnosis not present

## 2021-01-27 DIAGNOSIS — S0003XA Contusion of scalp, initial encounter: Secondary | ICD-10-CM | POA: Diagnosis not present

## 2021-01-27 DIAGNOSIS — W01198A Fall on same level from slipping, tripping and stumbling with subsequent striking against other object, initial encounter: Secondary | ICD-10-CM | POA: Diagnosis not present

## 2021-01-27 DIAGNOSIS — Y92002 Bathroom of unspecified non-institutional (private) residence single-family (private) house as the place of occurrence of the external cause: Secondary | ICD-10-CM | POA: Diagnosis not present

## 2021-01-27 DIAGNOSIS — S300XXA Contusion of lower back and pelvis, initial encounter: Secondary | ICD-10-CM | POA: Insufficient documentation

## 2021-01-27 DIAGNOSIS — S20229A Contusion of unspecified back wall of thorax, initial encounter: Secondary | ICD-10-CM

## 2021-01-27 DIAGNOSIS — M542 Cervicalgia: Secondary | ICD-10-CM | POA: Diagnosis not present

## 2021-01-27 DIAGNOSIS — S7002XA Contusion of left hip, initial encounter: Secondary | ICD-10-CM

## 2021-01-27 DIAGNOSIS — S0990XA Unspecified injury of head, initial encounter: Secondary | ICD-10-CM | POA: Diagnosis present

## 2021-01-27 DIAGNOSIS — S7012XA Contusion of left thigh, initial encounter: Secondary | ICD-10-CM | POA: Diagnosis not present

## 2021-01-27 MED ORDER — HYDROCODONE-ACETAMINOPHEN 5-325 MG PO TABS
1.0000 | ORAL_TABLET | Freq: Once | ORAL | Status: AC
  Administered 2021-01-27: 1 via ORAL
  Filled 2021-01-27: qty 1

## 2021-01-27 MED ORDER — HYDROCODONE-ACETAMINOPHEN 5-325 MG PO TABS
1.0000 | ORAL_TABLET | Freq: Two times a day (BID) | ORAL | 0 refills | Status: AC
Start: 2021-01-27 — End: ?

## 2021-01-27 NOTE — ED Provider Notes (Signed)
[facility] Emergency Department Provider Note  ____________________________________________   Event Date/Time   First MD Initiated Contact with Patient 01/27/21 0920     (approximate)  I have reviewed the triage vital signs and the nursing notes.   HISTORY  Chief Complaint Fall   HPI Larry Dalton is a 70 y.o. male presents to the ED with complaint of left posterior hip pain and neck pain.  Patient also complained to triage that he has pain to his head but denied any LOC.  Patient reports that he slipped and fell in the bathroom 4 days ago is continued to have pain since that time.  He denies any headache, visual changes or nausea or vomiting since his fall.  He states that currently he is using a cane to walk for added support.  Patient reports a recent CVA in which he now takes a blood thinner.  He rates his pain as a 10/10.       Past Medical History:  Diagnosis Date  . Hyperlipidemia   . Hypertension     Patient Active Problem List   Diagnosis Date Noted  . CVA (cerebral vascular accident) (HCC) 01/10/2021  . Essential hypertension 01/10/2021  . Nicotine dependence 01/10/2021    History reviewed. No pertinent surgical history.  Prior to Admission medications   Medication Sig Start Date End Date Taking? Authorizing Provider  HYDROcodone-acetaminophen (NORCO/VICODIN) 5-325 MG tablet Take 1 tablet by mouth 2 (two) times daily. Prn pain 01/27/21  Yes Tommi Rumps, PA-C  aspirin EC 81 MG tablet Take 1 tablet (81 mg total) by mouth daily for 21 days. Swallow whole. 01/11/21 02/01/21  Darlin Priestly, MD  atorvastatin (LIPITOR) 40 MG tablet Take 40 mg by mouth daily. 01/09/21   [provider]  clopidogrel (PLAVIX) 75 MG tablet Take 1 tablet (75 mg total) by mouth daily. To help prevent strokes. 01/12/21 04/12/21  Darlin Priestly, MD  hydrochlorothiazide (HYDRODIURIL) 25 MG tablet Take 25 mg by mouth daily. 01/09/21   [provider]  KLOR-CON M20  20 MEQ tablet Take 20 mEq by mouth 2 (two) times daily. 01/09/21   [provider]  tadalafil (CIALIS) 5 MG tablet Take 5 mg by mouth daily. 01/09/21   [provider]    Allergies Penicillins  History reviewed. No pertinent family history.  Social History Social History   Tobacco Use  . Smoking status: Current Every Day Smoker    Years: 30.00    Types: Cigarettes  . Smokeless tobacco: Never Used    Review of Systems Constitutional: No fever/chills Eyes: No visual changes. ENT: No sore throat.  No trauma. Cardiovascular: Denies chest pain. Respiratory: Denies shortness of breath. Gastrointestinal: No abdominal pain.  No nausea, no vomiting.   Genitourinary: Negative for dysuria. Musculoskeletal: Positive for back pain, left hip pain and cervical pain. Skin: Negative for rash. Neurological: Positive for head pain but patient denies a headache at this time.  Negative for focal weakness or numbness. ____________________________________________   PHYSICAL EXAM:  VITAL SIGNS: ED Triage Vitals  Enc Vitals Group     BP 01/27/21 0902 123/69     Pulse Rate 01/27/21 0902 75     Resp 01/27/21 0902 18     Temp 01/27/21 0900 98.4 F (36.9 C)     Temp Source 01/27/21 0900 Oral     SpO2 01/27/21 0902 99 %     Weight 01/27/21 0900 180 lb (81.6 kg)     Height 01/27/21  0900 6\' 3"  (1.905 m)     Head Circumference --      Peak Flow --      Pain Score 01/27/21 0900 10     Pain Loc --      Pain Edu? --      Excl. in GC? --     Constitutional: Alert and oriented. Well appearing and in no acute distress. Eyes: Conjunctivae are normal. PERRL. EOMI. Head: Atraumatic. Nose: No congestion/rhinnorhea. Mouth/Throat: Mucous membranes are moist.  Oropharynx non-erythematous. Neck: No stridor.  On exam there is some minimal tenderness on light palpation of cervical spine posteriorly approximately C3-C4, C4-C5, C5-C6.  No soft tissue injury or discoloration is  noted. Cardiovascular: Normal rate, regular rhythm. Grossly normal heart sounds.  Good peripheral circulation. Respiratory: Normal respiratory effort.  No retractions. Lungs CTAB.  Ribs are nontender to palpation and compression. Gastrointestinal: Soft and nontender. No distention.  No CVA tenderness.  Sounds are normoactive x4 quadrants. Musculoskeletal: Patient is able move upper and lower extremities and favors his left hip.  All other extremities move well and without pain.  There is no deformity noted on examination of the left hip nor is there any skin discoloration.  Patient is able to stand and bear weight without difficulty.  He is able to flex and extend his knee and ankle without difficulty.  Abduction and abduction of the left hip produces mild discomfort.  Patient also has some mild discomfort on palpation of thoracic and lumbar spine but no point tenderness is elicited.  No abrasions are noted on exam.  Patient currently ambulates with the use of a cane. Neurologic:  Normal speech and language.  Cranial nerves II through XII grossly intact.  No gross focal neurologic deficits are appreciated.  Skin:  Skin is warm, dry and intact.  No abrasions, soft tissue edema or ecchymosis noted. Psychiatric: Mood and affect are normal. Speech and behavior are normal.  ____________________________________________   LABS (all labs ordered are listed, but only abnormal results are displayed)  Labs Reviewed - No data to display  RADIOLOGY I, 01/29/21, personally viewed and evaluated these images (plain radiographs) as part of my medical decision making, as well as reviewing the written report by the radiologist.   Official radiology report(s): DG Thoracic Spine 2 View  Result Date: 01/27/2021 CLINICAL DATA:  Acute mid back pain following fall. Initial encounter. EXAM: THORACIC SPINE 2 VIEWS COMPARISON:  None. FINDINGS: There is no evidence of thoracic spine fracture. Alignment is normal.  No other significant bone abnormalities are identified. IMPRESSION: Negative. Electronically Signed   By: 01/29/2021 M.D.   On: 01/27/2021 10:17   DG Lumbar Spine 2-3 Views  Result Date: 01/27/2021 CLINICAL DATA:  Acute low back pain following fall. Initial encounter. EXAM: LUMBAR SPINE - 2-3 VIEW COMPARISON:  None. FINDINGS: There is no evidence of fracture or subluxation. Normal alignment is noted. Minimal multilevel degenerative disc disease noted. No focal bony lesions are identified. IMPRESSION: Negative. Electronically Signed   By: 01/29/2021 M.D.   On: 01/27/2021 10:16   CT Head Wo Contrast  Result Date: 01/27/2021 CLINICAL DATA:  70 year old male with fall in the bathroom, head pain neck pain EXAM: CT HEAD WITHOUT CONTRAST CT CERVICAL SPINE WITHOUT CONTRAST TECHNIQUE: Multidetector CT imaging of the head and cervical spine was performed following the standard protocol without intravenous contrast. Multiplanar CT image reconstructions of the cervical spine were also generated. COMPARISON:  None. FINDINGS: CT HEAD FINDINGS Brain: No  acute intracranial hemorrhage. No midline shift or mass effect. Gray-white differentiation maintained. Unremarkable appearance of the ventricular system. Vascular: Unremarkable. Skull: No acute fracture.  No aggressive bone lesion identified. Sinuses/Orbits: Unremarkable orbits. Minimal sinus disease of the sphenoid sinuses. Other: None CT CERVICAL SPINE FINDINGS Alignment: Straightening of the normal cervical lordosis. Anterolisthesis of C4 on C5 measuring 2 mm-3 mm. No subluxation. Skull base and vertebrae: No acute displaced fracture identified. No skull base fracture. Alignment at the skull base maintained. Calcifications of the alar/transverse ligament complex. No erosive changes. Soft tissues and spinal canal: Unremarkable cervical soft tissues. Lymph nodes are present, though not enlarged. Disc levels: Disc space narrowing throughout the cervical spine with  sclerosis of the endplates. Anterior osteophyte production is present throughout the cervical spine most prominent at C2-C3, C3-C4, C5-C6. Posterior disc osteophyte complex at C3-C4 with uncovertebral joint disease and facet disease contributes to bilateral foraminal narrowing. Uncovertebral joint disease at C5-C6 contributes to bilateral foraminal narrowing. Upper chest: Paraseptal/centrilobular emphysema at the lung apices. Other: No canal hematoma. IMPRESSION: Head CT: No acute intracranial abnormality. Cervical CT: No acute fracture or malalignment of the cervical spine. Degenerative changes. Electronically Signed   By: Gilmer Mor D.O.   On: 01/27/2021 10:06   CT Cervical Spine Wo Contrast  Result Date: 01/27/2021 CLINICAL DATA:  70 year old male with fall in the bathroom, head pain neck pain EXAM: CT HEAD WITHOUT CONTRAST CT CERVICAL SPINE WITHOUT CONTRAST TECHNIQUE: Multidetector CT imaging of the head and cervical spine was performed following the standard protocol without intravenous contrast. Multiplanar CT image reconstructions of the cervical spine were also generated. COMPARISON:  None. FINDINGS: CT HEAD FINDINGS Brain: No acute intracranial hemorrhage. No midline shift or mass effect. Gray-white differentiation maintained. Unremarkable appearance of the ventricular system. Vascular: Unremarkable. Skull: No acute fracture.  No aggressive bone lesion identified. Sinuses/Orbits: Unremarkable orbits. Minimal sinus disease of the sphenoid sinuses. Other: None CT CERVICAL SPINE FINDINGS Alignment: Straightening of the normal cervical lordosis. Anterolisthesis of C4 on C5 measuring 2 mm-3 mm. No subluxation. Skull base and vertebrae: No acute displaced fracture identified. No skull base fracture. Alignment at the skull base maintained. Calcifications of the alar/transverse ligament complex. No erosive changes. Soft tissues and spinal canal: Unremarkable cervical soft tissues. Lymph nodes are present,  though not enlarged. Disc levels: Disc space narrowing throughout the cervical spine with sclerosis of the endplates. Anterior osteophyte production is present throughout the cervical spine most prominent at C2-C3, C3-C4, C5-C6. Posterior disc osteophyte complex at C3-C4 with uncovertebral joint disease and facet disease contributes to bilateral foraminal narrowing. Uncovertebral joint disease at C5-C6 contributes to bilateral foraminal narrowing. Upper chest: Paraseptal/centrilobular emphysema at the lung apices. Other: No canal hematoma. IMPRESSION: Head CT: No acute intracranial abnormality. Cervical CT: No acute fracture or malalignment of the cervical spine. Degenerative changes. Electronically Signed   By: Gilmer Mor D.O.   On: 01/27/2021 10:06   DG HIP UNILAT WITH PELVIS 2-3 VIEWS LEFT  Result Date: 01/27/2021 CLINICAL DATA:  Acute LEFT hip pain following fall. Initial encounter. EXAM: DG HIP (WITH OR WITHOUT PELVIS) 2-3V LEFT COMPARISON:  None. FINDINGS: There is no evidence of hip fracture or dislocation. There is no evidence of arthropathy or other focal bone abnormality. IMPRESSION: Negative. Electronically Signed   By: Harmon Pier M.D.   On: 01/27/2021 10:15    ____________________________________________   PROCEDURES  Procedure(s) performed (including Critical Care):  Procedures   ____________________________________________   INITIAL IMPRESSION / ASSESSMENT AND PLAN / ED  COURSE  As part of my medical decision making, I reviewed the following data within the electronic MEDICAL RECORD NUMBER Notes from prior ED visits and [place] Controlled Substance Database  70 year old male presents to the ED with multiple complaints after he fell in the bathroom 4 days ago.  Patient has continued to be ambulatory with the use of a cane.  He denies any head injury or LOC.  Patient does report that he currently is taking blood thinners and rates his pain as 10/10.  He has not taken any over-the-counter  medication other than Tylenol because he is on blood thinners.  CT head and neck were negative for any acute changes, CT cervical spine does show multiple levels of degenerative changes.  Thoracic and lumbar spine shows degenerative changes.  Left hip was negative for acute changes and patient was reassured.  Patient was given hydrocodone 5 mg prior to x-rays and at the time of discussing his x-ray findings patient states that his pain was easing off a great deal.  A prescription for 12 was sent to his pharmacy with instructions to take 1 twice a day and patient is aware that this could cause drowsiness and increase his risk for falling.  He is to follow-up with his primary care provider if any continued problems.  ____________________________________________   FINAL CLINICAL IMPRESSION(S) / ED DIAGNOSES  Final diagnoses:  Pain  Contusion of left hip and thigh, initial encounter  Contusion of scalp, initial encounter  Contusion of lower back, initial encounter  Contusion of upper back, unspecified laterality, initial encounter  Fall at home, initial encounter     ED Discharge Orders         Ordered    HYDROcodone-acetaminophen (NORCO/VICODIN) 5-325 MG tablet  2 times daily        01/27/21 1150          *Please note:  Larry Dalton was evaluated in Emergency Department on 01/27/2021 for the symptoms described in the history of present illness. He was evaluated in the context of the global COVID-19 pandemic, which necessitated consideration that the patient might be at risk for infection with the SARS-CoV-2 virus that causes COVID-19. Institutional protocols and algorithms that pertain to the evaluation of patients at risk for COVID-19 are in a state of rapid change based on information released by regulatory bodies including the CDC and federal and state organizations. These policies and algorithms were followed during the patient's care in the ED.  Some ED evaluations and interventions  may be delayed as a result of limited staffing during and the pandemic.*   Note:  This document was prepared using Dragon voice recognition software and may include unintentional dictation errors.    Tommi RumpsSummers, Dinesha Twiggs L, PA-C 01/27/21 1216    Jene EveryKinner, Robert, MD 01/27/21 [phone]

## 2021-01-27 NOTE — ED Triage Notes (Signed)
Pt ambulatory with cane into lobby c/o of fall in bathroom Tuesday night. C/o L hip pain, neck and head pain. Denies LOC. A&O, in wheelchair.

## 2021-01-27 NOTE — ED Notes (Signed)
See triage note presents s/p fall   States he lost his balance and fell in the bathroom Tuesday    Having pain to left posterior hip area  States he did not hit his head  But cont's to have increased pain

## 2021-01-27 NOTE — Discharge Instructions (Addendum)
Follow-up with your primary care provider if any continued problems or concerns.  Take pain medication twice a day with a and be aware that this medication could cause drowsiness and increase your risk for falling.  You may use ice or heat to your back as needed for discomfort and long with area to your neck.

## 2021-04-30 ENCOUNTER — Ambulatory Visit: Payer: BC Managed Care – PPO | Admitting: Physical Therapy

## 2021-05-28 ENCOUNTER — Ambulatory Visit: Payer: BC Managed Care – PPO | Attending: Family Medicine | Admitting: Physical Therapy

## 2021-05-28 ENCOUNTER — Encounter: Payer: Self-pay | Admitting: Physical Therapy

## 2021-05-28 ENCOUNTER — Other Ambulatory Visit: Payer: Self-pay

## 2021-05-28 DIAGNOSIS — I639 Cerebral infarction, unspecified: Secondary | ICD-10-CM | POA: Diagnosis present

## 2021-05-28 DIAGNOSIS — M6281 Muscle weakness (generalized): Secondary | ICD-10-CM

## 2021-05-28 NOTE — Therapy (Signed)
[facility] [address] [place], [place], [address] Phone: [phone]   Fax:  [phone]  Physical Therapy Evaluation  Patient Details  Name: Larry Dalton MRN: [id number] Date of Birth: [birth year] Referring Provider (PT): Dr. Rolin Barry  Encounter Date: 05/28/2021   PT End of Session - 05/28/21 1111     Visit Number 1    Number of Visits 1    Date for PT Re-Evaluation 05/29/21    PT Start Time 0736    PT Stop Time 0851    PT Time Calculation (min) 75 min    Activity Tolerance Patient tolerated treatment well;Patient limited by fatigue             Past Medical History:  Diagnosis Date   Hyperlipidemia    Hypertension     History reviewed. No pertinent surgical history.  There were no vitals filed for this visit.    Subjective Assessment - 05/28/21 0751     Subjective See FCE report.  Pt. reports no pain or issues.  Pt. has been out of work since CVA on 01/22/2021.    Pertinent History see FCE    Limitations House hold activities;Walking;Standing    Patient Stated Goals Complete FCE report    Currently in Pain? No/denies              Cervical AROM WFL (no pain) B shoulder UE/LE AROM WNL B UE and LE strength grossly 5/5 MMT.  No strength deficits noted.   R handed:   Grip strength R 118.9#, L 83.8#  5xSTS: 13.5 seconds average from two trails   Tandem gait: forward/backwards (1 mistake with backwards)     [facility] PT Assessment - 05/28/21 0001       Assessment   Medical Diagnosis Transient Ischemic Attack/ Syncope due to orthostatic hypotension/ Dizziness    Referring Provider (PT) Dr. Rolin Barry    Onset Date/Surgical Date 02/01/21    Hand Dominance Right    Prior Therapy No      Precautions   Precautions None      Balance Screen   Has the patient fallen in the past 6 months Yes    How many times? 1    Has the patient had a decrease in activity level because of a fear of falling?   Yes      Home Environment   Living Environment Private residence      Prior Function   Level of Independence Independent                  Objective measurements completed on examination: See above findings.      Plan - 05/28/21 1112     Clinical Impression Statement Overall Level of Work: Falls within the Medium range.  Exerting 20 to 50 pounds of force occasionally, and/or 10 to 25 pounds of force frequently, and/or greater than negligible up to 10 pounds of force constantly to move objects.  Physical Demand requirements are in excess of those for Light Work.    Please see the Task Performance Table for specific abilities.  Tolerance for the 8-Hour Day: Based on the individual task scores in Dynamic Strength, Position Tolerance and Mobility, the client is able to tolerate the Medium level of work for the 8-hour day/40-hour week.    Stability/Clinical Decision Making Stable/Uncomplicated    Clinical Decision Making Low    Rehab Potential Good    PT Frequency One time visit  PT Treatment/Interventions Functional mobility training;Therapeutic activities;Therapeutic exercise;Balance training;Neuromuscular re-education;Patient/family education    PT Next Visit Plan FCE only.  Report faxed to MD office.             Patient will benefit from skilled therapeutic intervention in order to improve the following deficits and impairments:  Decreased endurance, Decreased balance, Decreased activity tolerance, Decreased strength, Postural dysfunction  Visit Diagnosis: Cerebrovascular accident (CVA), unspecified mechanism (HCC)  Muscle weakness (generalized)     Problem List Patient Active Problem List   Diagnosis Date Noted   CVA (cerebral vascular accident) (HCC) 01/10/2021   Essential hypertension 01/10/2021   Nicotine dependence 01/10/2021   Cammie Mcgee, PT, DPT # [phone] 05/28/2021, 1:11 PM  [place] [facility] [address] [place], [place], [address] Phone: [phone]   Fax:  [phone]  Name: Larry Dalton MRN: [id number] Date of Birth: [birth year]

## 2022-09-21 IMAGING — CR DG LUMBAR SPINE 2-3V
3 series · 3 of 3 positions shown · non-contrast
Comparison: None.

CLINICAL DATA: Acute low back pain following fall. Initial
encounter.

EXAM:
LUMBAR SPINE - 2-3 VIEW

[l-spine ap]
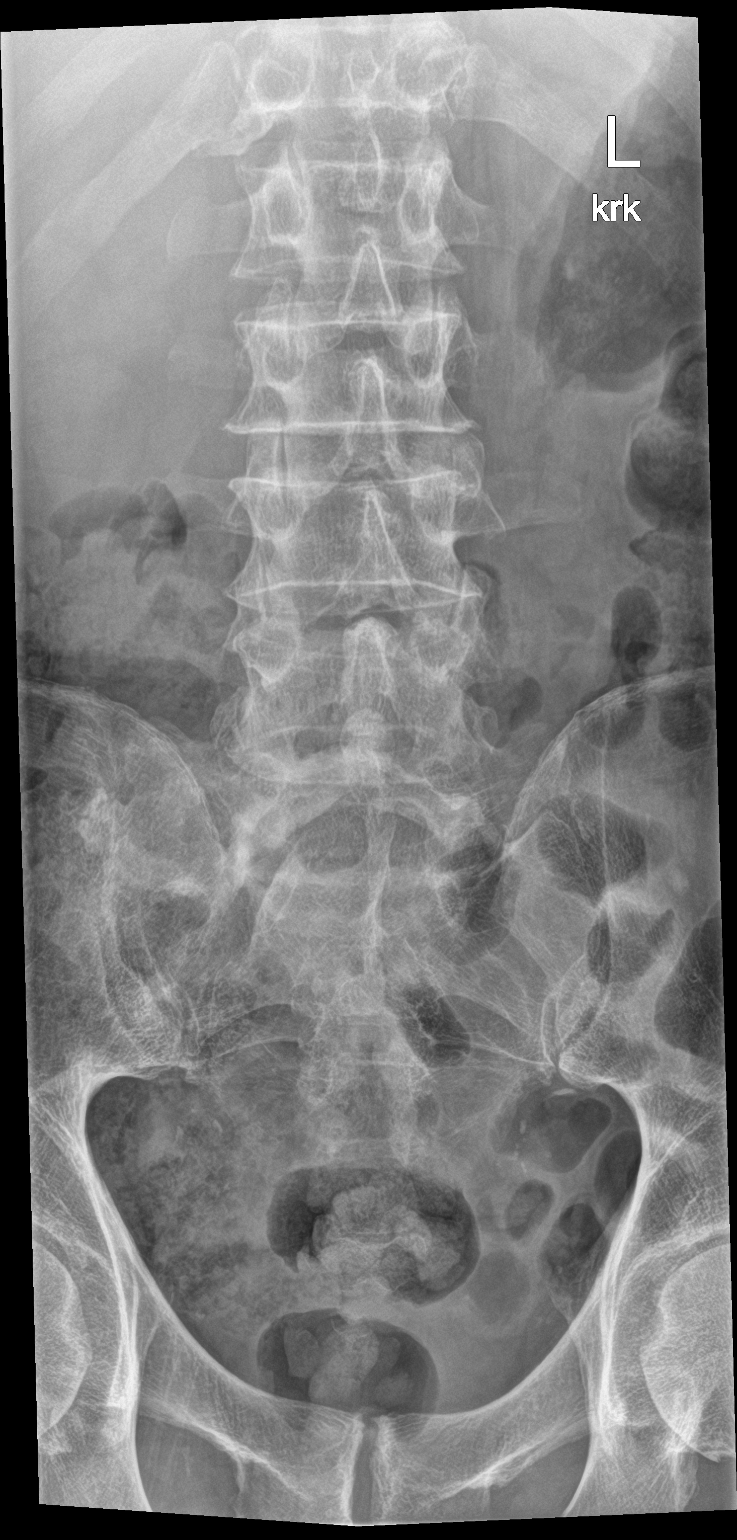

[l-spine lat]
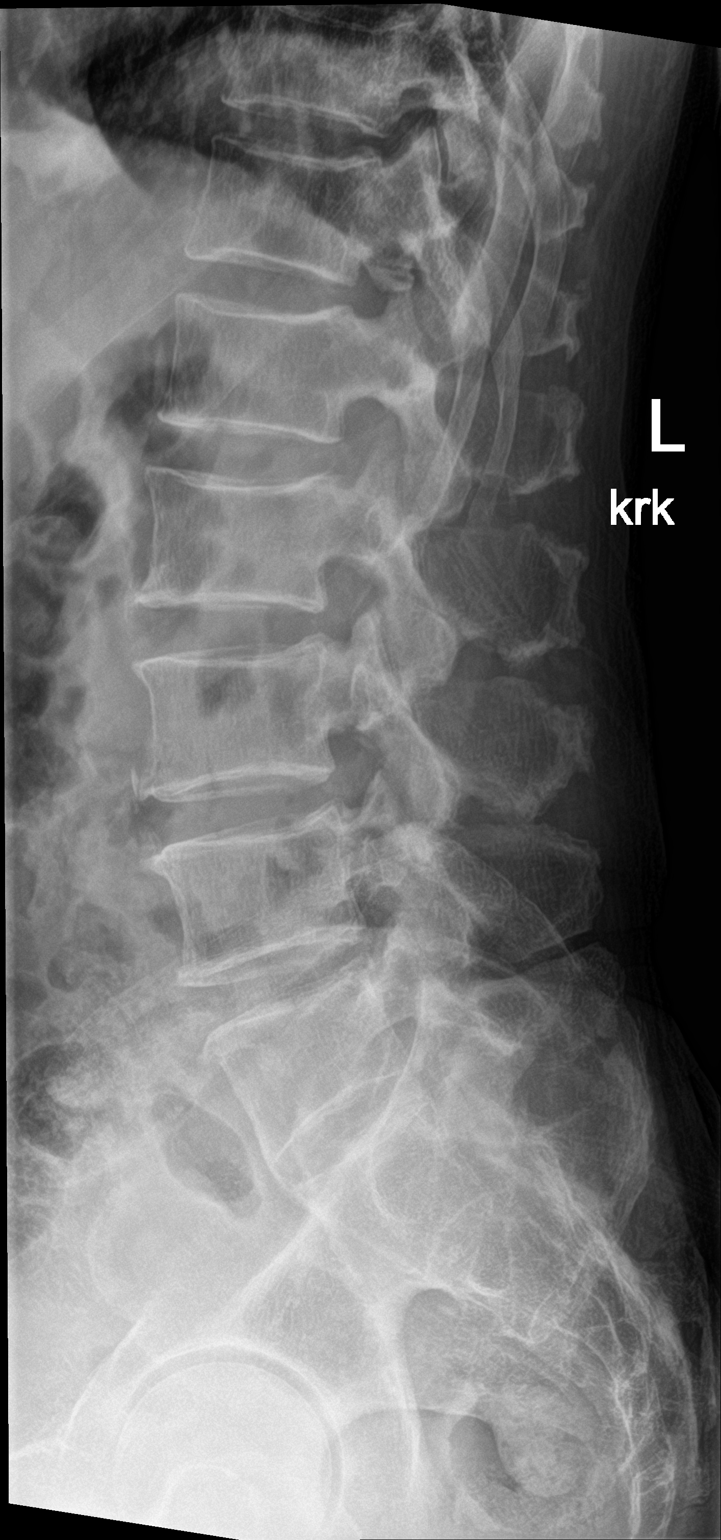

[l-spine spot]
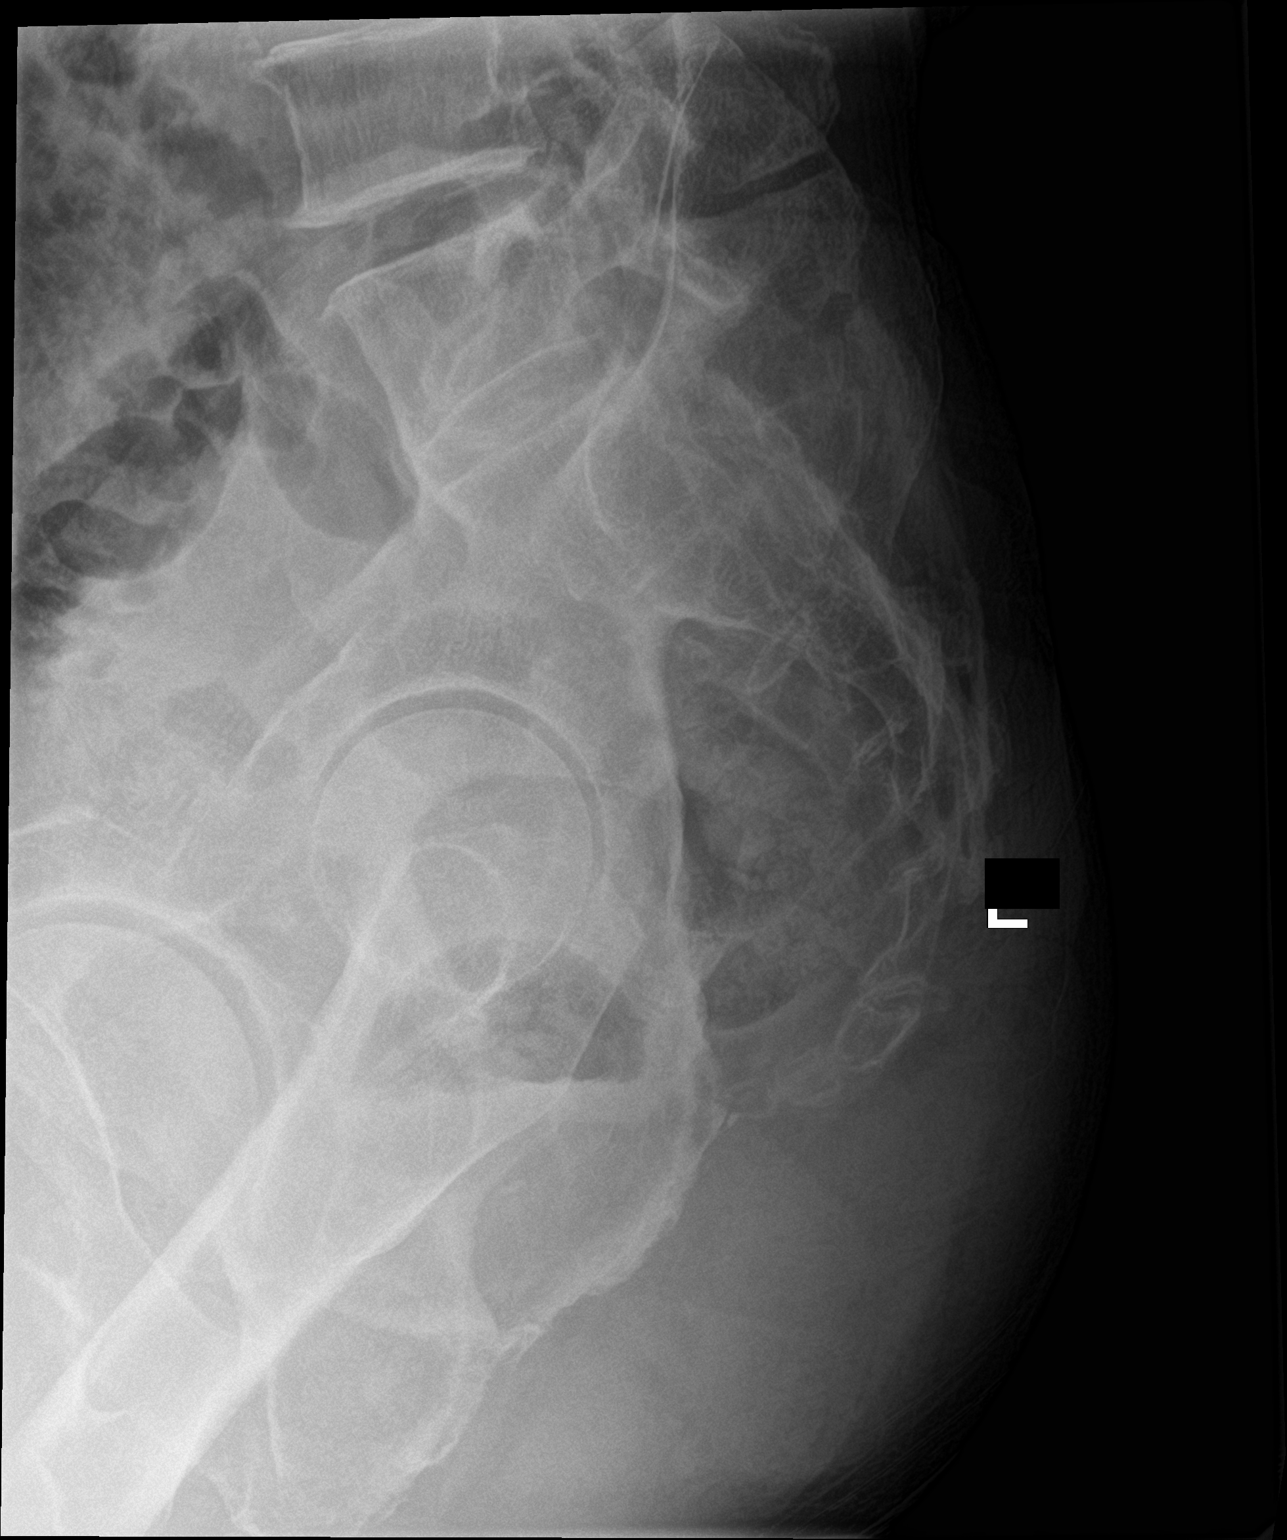

[3 of 3 positions shown; findings below may reference images not displayed]

FINDINGS: There is no evidence of fracture or subluxation.

Normal alignment is noted.

Minimal multilevel degenerative disc disease noted.

No focal bony lesions are identified.
IMPRESSION: Negative.

## 2022-09-21 IMAGING — CR DG HIP (WITH OR WITHOUT PELVIS) 2-3V*L*
3 series · 3 of 3 positions shown · non-contrast
Comparison: None.

CLINICAL DATA: Acute LEFT hip pain following fall. Initial
encounter.

EXAM:
DG HIP (WITH OR WITHOUT PELVIS) 2-3V LEFT

[pelvis ap]
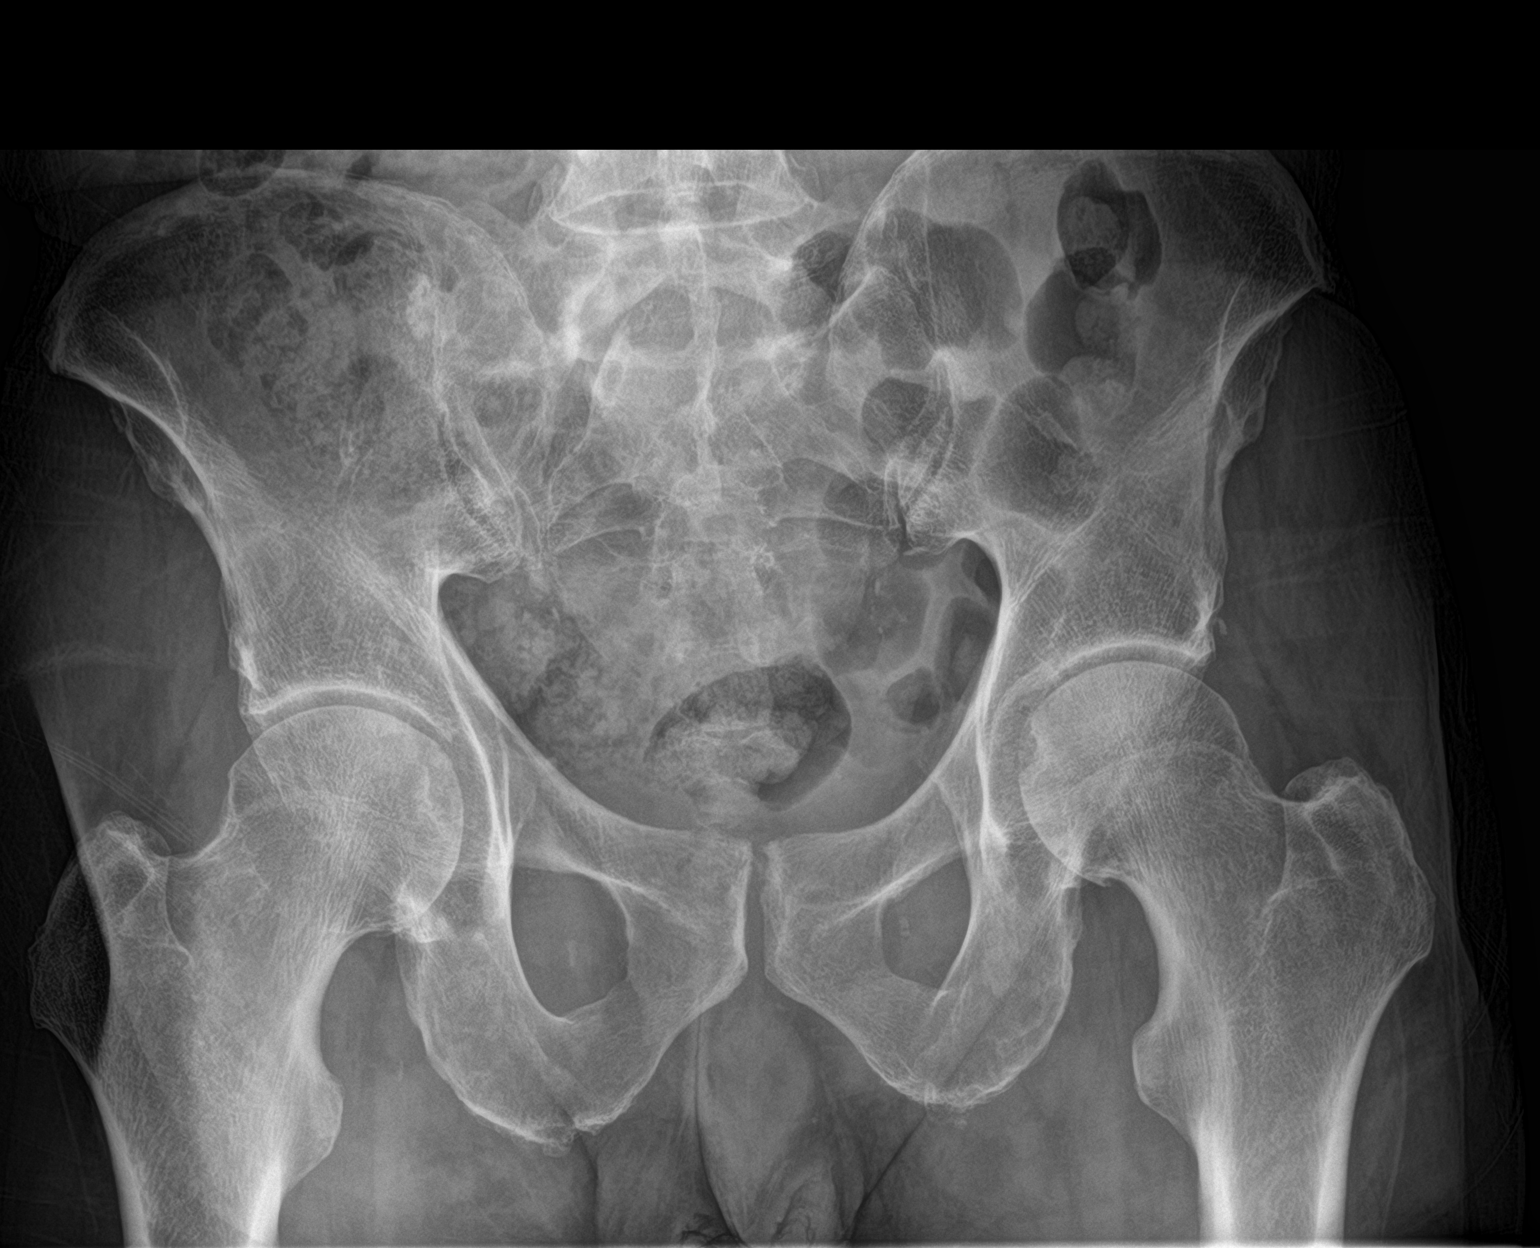

[hip ap]
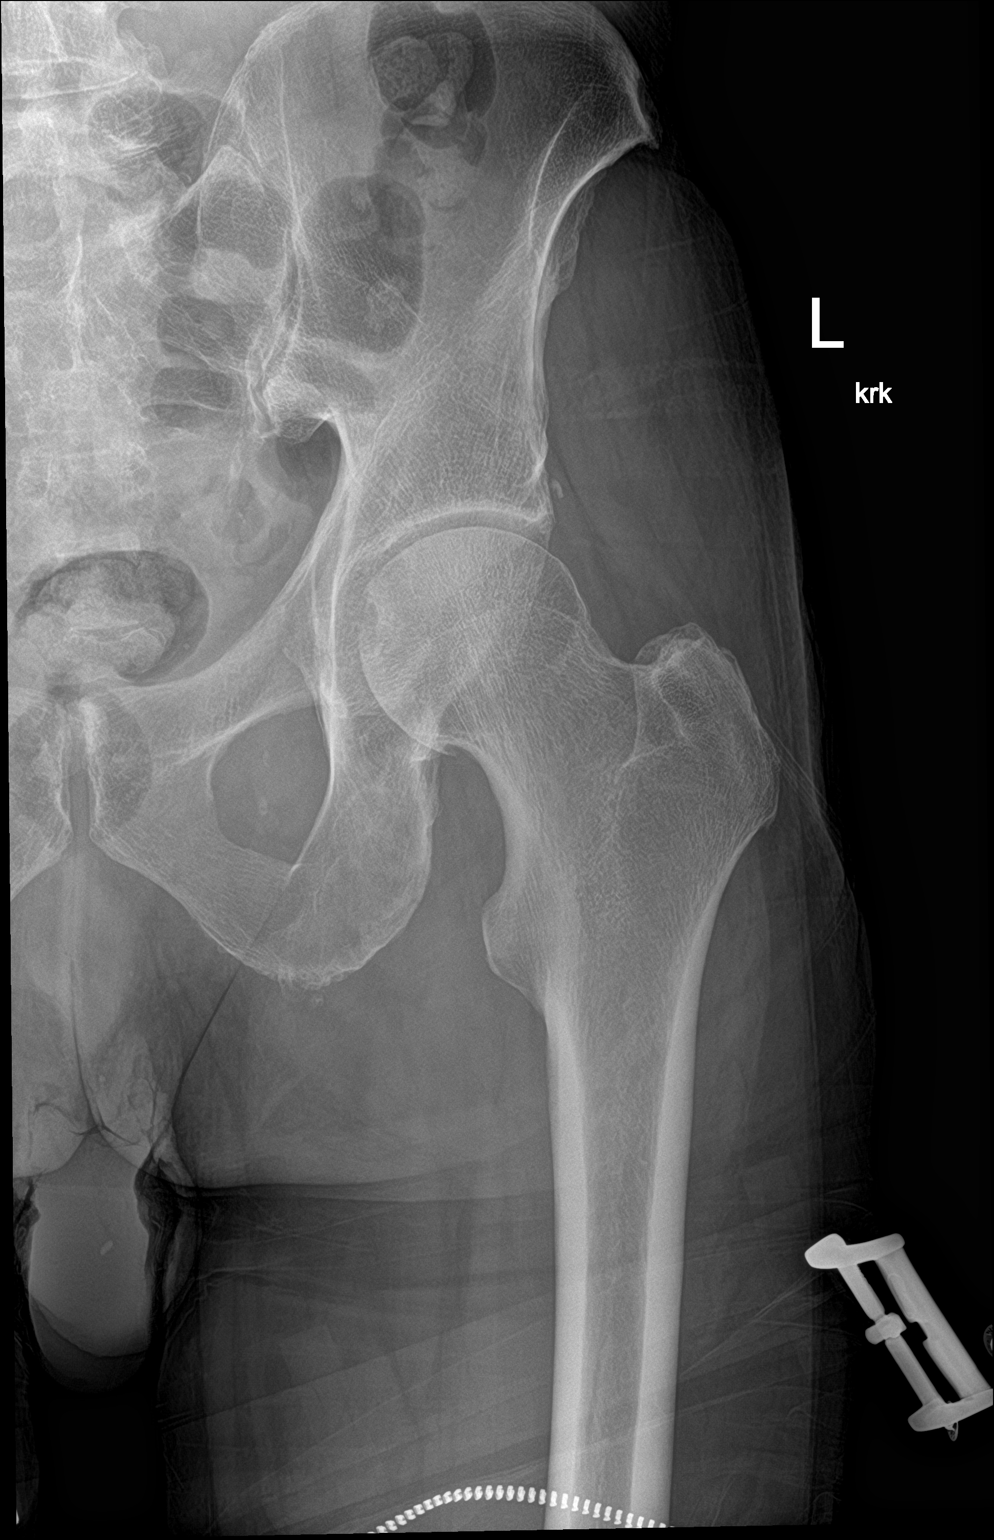

[hip lat]
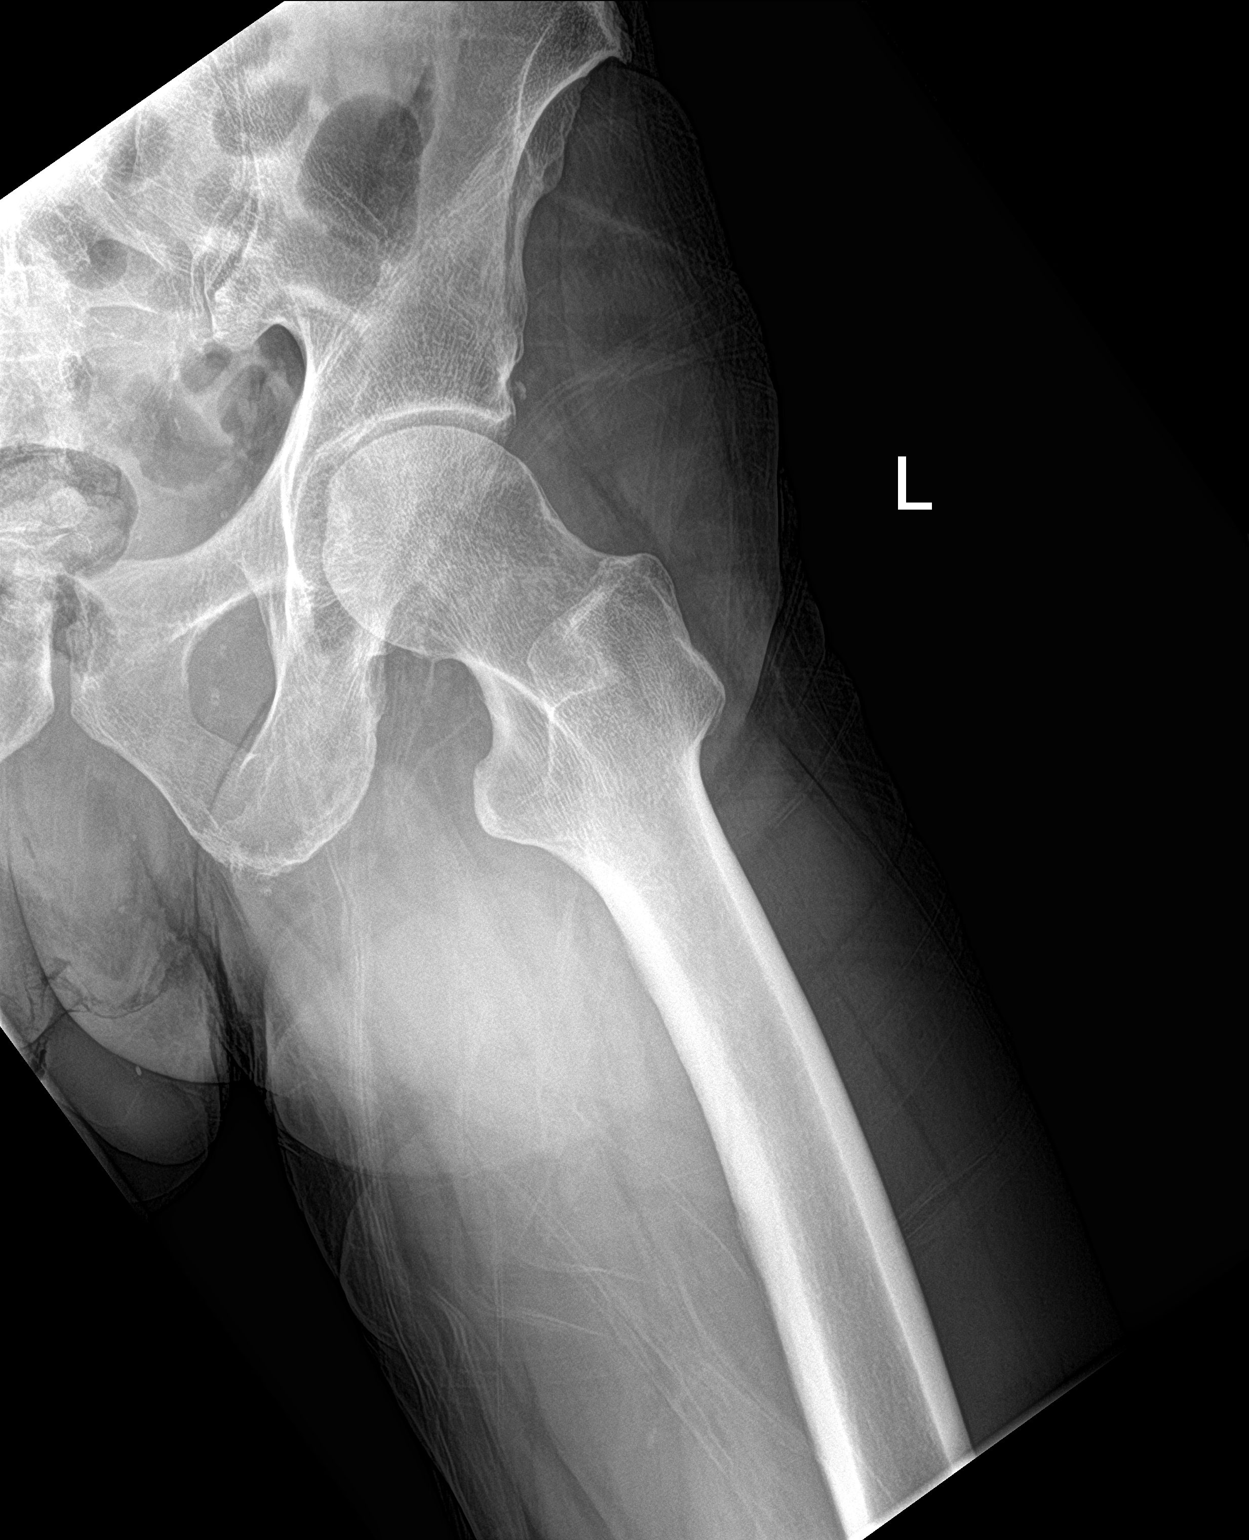

[3 of 3 positions shown; findings below may reference images not displayed]

FINDINGS: There is no evidence of hip fracture or dislocation. There is no
evidence of arthropathy or other focal bone abnormality.
IMPRESSION: Negative.
